# Patient Record
Sex: Female | Born: 1965 | Race: White | Hispanic: No | Marital: Married | Smoking: Never smoker
Health system: Southern US, Community
[De-identification: ages and names within clinical notes are randomized; demographics above are authoritative.]

## PROBLEM LIST (undated history)

## (undated) DIAGNOSIS — K219 Gastro-esophageal reflux disease without esophagitis: Secondary | ICD-10-CM

## (undated) DIAGNOSIS — M8580 Other specified disorders of bone density and structure, unspecified site: Secondary | ICD-10-CM

## (undated) DIAGNOSIS — S161XXA Strain of muscle, fascia and tendon at neck level, initial encounter: Secondary | ICD-10-CM

## (undated) DIAGNOSIS — M1990 Unspecified osteoarthritis, unspecified site: Secondary | ICD-10-CM

## (undated) DIAGNOSIS — Z923 Personal history of irradiation: Secondary | ICD-10-CM

## (undated) DIAGNOSIS — C50919 Malignant neoplasm of unspecified site of unspecified female breast: Secondary | ICD-10-CM

## (undated) DIAGNOSIS — R112 Nausea with vomiting, unspecified: Secondary | ICD-10-CM

## (undated) DIAGNOSIS — Z9889 Other specified postprocedural states: Secondary | ICD-10-CM

## (undated) HISTORY — DX: Unspecified osteoarthritis, unspecified site: M19.90

## (undated) HISTORY — DX: Other specified postprocedural states: Z98.890

## (undated) HISTORY — PX: AUGMENTATION MAMMAPLASTY: SUR837

## (undated) HISTORY — PX: OTHER SURGICAL HISTORY: SHX169

## (undated) HISTORY — DX: Gastro-esophageal reflux disease without esophagitis: K21.9

## (undated) HISTORY — DX: Malignant neoplasm of unspecified site of unspecified female breast: C50.919

## (undated) HISTORY — DX: Other specified postprocedural states: R11.2

## (undated) HISTORY — DX: Other specified disorders of bone density and structure, unspecified site: M85.80

## (undated) HISTORY — DX: Strain of muscle, fascia and tendon at neck level, initial encounter: S16.1XXA

---
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
## 1991-12-24 ENCOUNTER — Encounter: Payer: Self-pay | Admitting: Family Medicine

## 1991-12-24 LAB — CONVERTED CEMR LAB
RBC count: 4.48 10*6/uL
WBC, blood: 6.1 10*3/uL

## 1997-09-24 ENCOUNTER — Encounter: Payer: Self-pay | Admitting: Family Medicine

## 1997-09-24 LAB — CONVERTED CEMR LAB
RBC count: 4.39 10*6/uL
WBC, blood: 5.9 10*3/uL

## 2000-08-27 ENCOUNTER — Other Ambulatory Visit: Admission: RE | Admit: 2000-08-27 | Discharge: 2000-08-27 | Payer: Self-pay | Admitting: Obstetrics and Gynecology

## 2001-08-01 ENCOUNTER — Encounter: Payer: Self-pay | Admitting: Family Medicine

## 2001-08-01 LAB — CONVERTED CEMR LAB
RBC count: 4.55 10*6/uL
TSH: 1.511 microintl units/mL
WBC, blood: 6 10*3/uL

## 2001-08-11 ENCOUNTER — Encounter: Payer: Self-pay | Admitting: Gastroenterology

## 2001-08-11 ENCOUNTER — Ambulatory Visit (HOSPITAL_COMMUNITY): Admission: RE | Admit: 2001-08-11 | Discharge: 2001-08-11 | Payer: Self-pay | Admitting: Gastroenterology

## 2001-08-22 ENCOUNTER — Encounter (INDEPENDENT_AMBULATORY_CARE_PROVIDER_SITE_OTHER): Payer: Self-pay | Admitting: Specialist

## 2001-08-22 ENCOUNTER — Encounter: Payer: Self-pay | Admitting: Gastroenterology

## 2001-08-22 ENCOUNTER — Other Ambulatory Visit: Admission: RE | Admit: 2001-08-22 | Discharge: 2001-08-22 | Payer: Self-pay | Admitting: Gastroenterology

## 2001-09-29 ENCOUNTER — Ambulatory Visit (HOSPITAL_COMMUNITY): Admission: RE | Admit: 2001-09-29 | Discharge: 2001-09-29 | Payer: Self-pay | Admitting: Gastroenterology

## 2001-09-30 ENCOUNTER — Other Ambulatory Visit: Admission: RE | Admit: 2001-09-30 | Discharge: 2001-09-30 | Payer: Self-pay | Admitting: Obstetrics and Gynecology

## 2001-10-07 ENCOUNTER — Encounter: Admission: RE | Admit: 2001-10-07 | Discharge: 2002-01-05 | Payer: Self-pay | Admitting: Otolaryngology

## 2002-11-02 ENCOUNTER — Other Ambulatory Visit: Admission: RE | Admit: 2002-11-02 | Discharge: 2002-11-02 | Payer: Self-pay | Admitting: Obstetrics and Gynecology

## 2003-12-01 ENCOUNTER — Other Ambulatory Visit: Admission: RE | Admit: 2003-12-01 | Discharge: 2003-12-01 | Payer: Self-pay | Admitting: Obstetrics and Gynecology

## 2004-11-17 ENCOUNTER — Ambulatory Visit: Payer: Self-pay | Admitting: Gastroenterology

## 2004-12-27 ENCOUNTER — Other Ambulatory Visit: Admission: RE | Admit: 2004-12-27 | Discharge: 2004-12-27 | Payer: Self-pay | Admitting: Obstetrics and Gynecology

## 2005-01-24 ENCOUNTER — Ambulatory Visit: Payer: Self-pay | Admitting: Family Medicine

## 2005-10-08 ENCOUNTER — Ambulatory Visit: Payer: Self-pay | Admitting: Family Medicine

## 2006-01-17 ENCOUNTER — Ambulatory Visit: Payer: Self-pay | Admitting: Gastroenterology

## 2006-02-06 ENCOUNTER — Other Ambulatory Visit: Admission: RE | Admit: 2006-02-06 | Discharge: 2006-02-06 | Payer: Self-pay | Admitting: Obstetrics and Gynecology

## 2006-10-22 HISTORY — PX: NECK SURGERY: SHX720

## 2007-07-30 ENCOUNTER — Ambulatory Visit: Payer: Self-pay | Admitting: Gastroenterology

## 2007-08-12 ENCOUNTER — Ambulatory Visit: Payer: Self-pay | Admitting: Family Medicine

## 2007-08-13 ENCOUNTER — Ambulatory Visit: Payer: Self-pay | Admitting: Family Medicine

## 2007-08-15 ENCOUNTER — Encounter: Payer: Self-pay | Admitting: Family Medicine

## 2007-08-25 ENCOUNTER — Ambulatory Visit: Payer: Self-pay | Admitting: *Deleted

## 2007-09-02 ENCOUNTER — Encounter: Payer: Self-pay | Admitting: Family Medicine

## 2007-09-12 ENCOUNTER — Inpatient Hospital Stay (HOSPITAL_COMMUNITY): Admission: RE | Admit: 2007-09-12 | Discharge: 2007-09-13 | Payer: Self-pay | Admitting: Neurosurgery

## 2007-09-12 ENCOUNTER — Ambulatory Visit: Payer: Self-pay | Admitting: Internal Medicine

## 2007-10-06 ENCOUNTER — Encounter: Payer: Self-pay | Admitting: Family Medicine

## 2008-02-26 ENCOUNTER — Ambulatory Visit: Payer: Self-pay | Admitting: Family Medicine

## 2008-02-27 ENCOUNTER — Encounter: Payer: Self-pay | Admitting: Family Medicine

## 2008-02-27 ENCOUNTER — Telehealth (INDEPENDENT_AMBULATORY_CARE_PROVIDER_SITE_OTHER): Payer: Self-pay | Admitting: Internal Medicine

## 2008-03-01 ENCOUNTER — Ambulatory Visit: Payer: Self-pay | Admitting: Family Medicine

## 2008-03-01 ENCOUNTER — Telehealth: Payer: Self-pay | Admitting: Family Medicine

## 2008-03-04 ENCOUNTER — Telehealth: Payer: Self-pay | Admitting: Family Medicine

## 2008-03-04 ENCOUNTER — Encounter: Payer: Self-pay | Admitting: Family Medicine

## 2008-03-22 ENCOUNTER — Ambulatory Visit: Payer: Self-pay | Admitting: Family Medicine

## 2008-04-09 IMAGING — CR DG CERVICAL SPINE 2 OR 3 VIEWS
1 series · 1 of 1 positions shown · non-contrast
Comparison: none

CLINICAL DATA: Anterior cervical spine fusion.
 CERVICAL SPINE ? 2 VIEW:

[view not recorded]
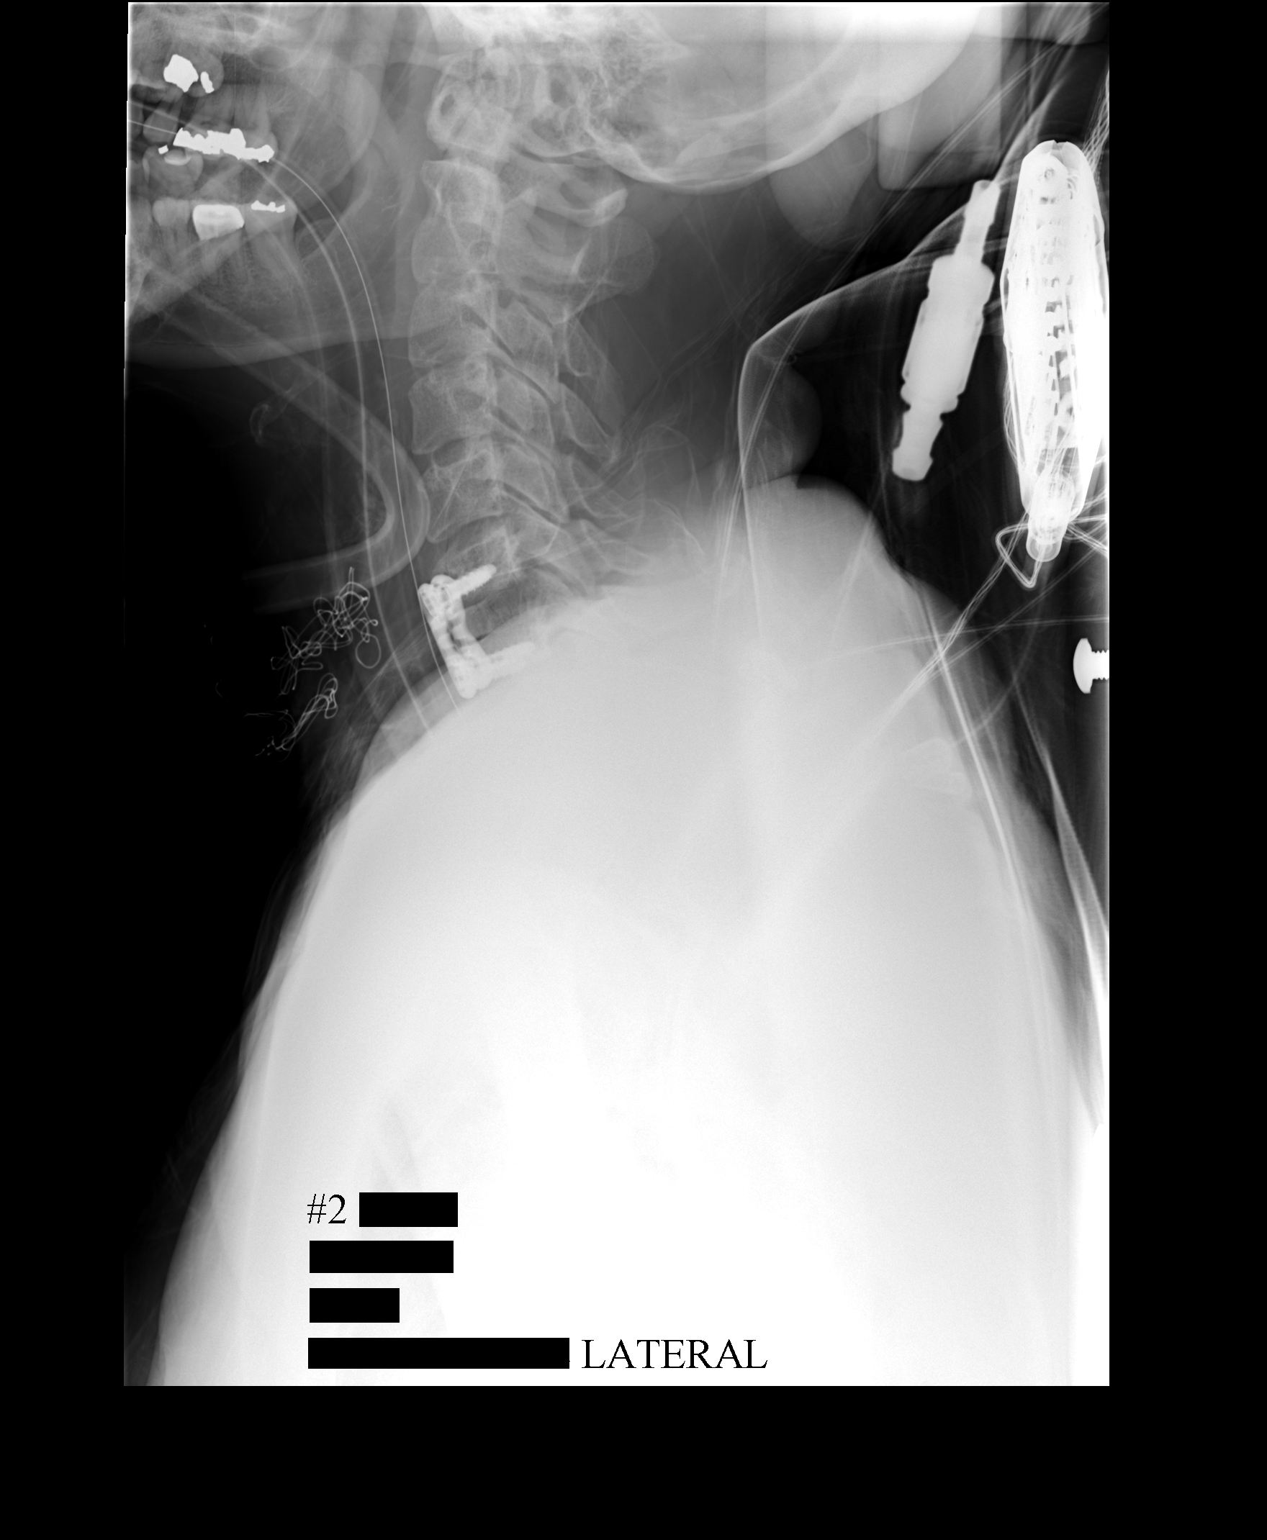

[1 of 1 positions shown; findings below may reference images not displayed]

FINDINGS: Two lateral views of the cervical spine were returned.  The initial film shows a needle positioned anteriorly directed towards the C6-7 level.  A second film shows anterior fusion has been performed at C6-7.  The interbody fusion plug is in good position.
IMPRESSION: Anterior fusion at C6-7.

## 2008-05-25 ENCOUNTER — Encounter: Admission: RE | Admit: 2008-05-25 | Discharge: 2008-05-25 | Payer: Self-pay | Admitting: Neurosurgery

## 2008-09-27 ENCOUNTER — Telehealth: Payer: Self-pay | Admitting: Family Medicine

## 2008-10-05 ENCOUNTER — Telehealth: Payer: Self-pay | Admitting: Family Medicine

## 2008-10-06 ENCOUNTER — Telehealth: Payer: Self-pay | Admitting: Family Medicine

## 2008-10-13 ENCOUNTER — Ambulatory Visit: Payer: Self-pay | Admitting: Family Medicine

## 2008-10-13 DIAGNOSIS — K219 Gastro-esophageal reflux disease without esophagitis: Secondary | ICD-10-CM | POA: Insufficient documentation

## 2008-11-04 ENCOUNTER — Ambulatory Visit: Payer: Self-pay | Admitting: Family Medicine

## 2009-03-28 ENCOUNTER — Ambulatory Visit: Payer: Self-pay | Admitting: Family Medicine

## 2009-03-29 ENCOUNTER — Encounter: Payer: Self-pay | Admitting: Family Medicine

## 2009-03-30 ENCOUNTER — Ambulatory Visit: Payer: Self-pay | Admitting: Family Medicine

## 2009-03-30 DIAGNOSIS — J383 Other diseases of vocal cords: Secondary | ICD-10-CM | POA: Insufficient documentation

## 2009-10-18 ENCOUNTER — Encounter: Payer: Self-pay | Admitting: Family Medicine

## 2009-10-18 ENCOUNTER — Telehealth: Payer: Self-pay | Admitting: Family Medicine

## 2010-05-29 ENCOUNTER — Encounter (INDEPENDENT_AMBULATORY_CARE_PROVIDER_SITE_OTHER): Payer: Self-pay | Admitting: *Deleted

## 2010-10-25 ENCOUNTER — Encounter: Payer: Self-pay | Admitting: Family Medicine

## 2010-10-25 ENCOUNTER — Telehealth: Payer: Self-pay | Admitting: Family Medicine

## 2010-10-26 ENCOUNTER — Ambulatory Visit
Admission: RE | Admit: 2010-10-26 | Discharge: 2010-10-26 | Payer: Self-pay | Source: Home / Self Care | Attending: Family Medicine | Admitting: Family Medicine

## 2010-11-21 NOTE — Medication Information (Signed)
Summary: Approval for Additional Quantity Nexium/[employer]  Approval for Additional Quantity Nexium/[employer]   Imported By: Lanelle Bal 10/24/2009 13:23:18  _____________________________________________________________________  External Attachment:    Type:   Image     Comment:   External Document

## 2010-11-21 NOTE — Letter (Signed)
Summary: Nadara Eaton letter  [place] at [facility]  [address] [place], [place] [address]   Phone: [phone]  Fax: [phone]       05/29/2010 MRN: [id number]  [facility] [address] [place], [place]  [address]  Dear Ms. Bumpass,  [facility] and [place] announce the retirement of Arta Silence, M.D., from full-time practice at the [facility] office effective April 20, 2010 and his plans of returning part-time.  It is important to Dr. Hetty Ely and to our practice that you understand that [facility] has seven physicians in our office for your health care needs.  We will continue to offer the same exceptional care that you have today.    Dr. Hetty Ely has spoken to many of you about his plans for retirement and returning part-time in the fall.   We will continue to work with you through the transition to schedule appointments for you in the office and meet the high standards that [place] is committed to.   Again, it is with great pleasure that we share the news that Dr. Hetty Ely will return to [facility] in October of 2011 with a reduced schedule.    If you have any questions, or would like to request an appointment with one of our physicians, please call us at [phone] and press the option for Scheduling an appointment.  We take pleasure in providing you with excellent patient care and look forward to seeing you at your next office visit.  Our [facility] Physicians are:  Tillman Abide, M.D. Laurita Quint, M.D. Roxy Manns, M.D. Kerby Nora, M.D. Hannah Beat, M.D. Ruthe Mannan, M.D. We proudly welcomed Raechel Ache, M.D. and Eustaquio Boyden, M.D. to the practice in July/August 2011.  Sincerely,  [facility]

## 2010-11-23 NOTE — Assessment & Plan Note (Signed)
Summary: refill meds/alc   Vital Signs:  Patient profile:   45 year old female Height:      66.5 inches Weight:      144.69 pounds BMI:     23.09 Temp:     98.8 degrees F oral Pulse rate:   76 / minute Pulse rhythm:   regular BP sitting:   110 / 70  (left arm) Cuff size:   regular  Vitals Entered By: Sydell Axon LPN (October 26, 2010 2:37 PM) CC: Needs a refill on medication   History of Present Illness: Pt here for foillowup, hasn't been here in 11/2 years and feels well. She takes Nexium twice a day and sees if not taken, she has burning in the throat/midchest area and belching. She has had lifelong problems with GERD, had EGD initially in HS. She tries to watch what she eats and knows tomato and orange products bother her significantly. Her last EGD was 2002. She does not think sxs have worsened or changed and are reasonably well controlled with regular twice a day medication. She has not had hoarseness or difficutly with her voice for a long time. That had been a problem around the time of her last EGD. She does say that stress worsens sxs. 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 During the abd exam, when asked, does have occas RUQ discomfort.  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 1 Tab Po Two Times A Day As Needed. 2)  Prednisone 50 Mg Tabs (Prednisone) .... One Tab By Mouth in Am. 3)  Guiatuss Ac 100-10 Mg/68ml Syrp (Guaifenesin-Codeine) .... 1/2- 1 Tsp By Mouth At Night As Needed 4)  Zithromax Z-Pak 250 Mg Tabs (Azithromycin) .... As Dir  Current Medications (verified): 1)  Nexium 40 Mg  Cpdr (Esomeprazole Magnesium) .Marland Kitchen.. 1 Tab Po Two Times A Day As Needed. 2)  Multivitamins  Tabs  (Multiple Vitamin) .... Take One By Mouth Daily 3)  Vitamin E 400 Unit Caps (Vitamin E) .... Take One By Mouth Daily  Allergies: No Known Drug Allergies  Physical Exam  General:  Well-developed,well-nourished,in no acute distress; alert,appropriate and cooperative throughout examination Head:  Normocephalic and atraumatic without obvious abnormalities. No apparent alopecia or balding. Eyes:  Conjunctiva clear bilaterally.  Ears:  External ear exam shows no significant lesions or deformities.  Otoscopic examination reveals clear canals, tympanic membranes are intact bilaterally without bulging, retraction, inflammation or discharge. Hearing is grossly normal bilaterally. Nose:  External nasal examination shows no deformity or inflammation. Nasal mucosa are pink and moist without lesions or exudates. Mouth:  Oral mucosa and oropharynx without lesions or exudates.  Teeth in good repair. Neck:  No deformities, masses, or tenderness noted. Lungs:  Min ronchi of the chest. Heart:  Normal rate and regular rhythm. S1 and S2 normal without gallop, murmur, click, rub or other extra sounds. Abdomen:  Bowel sounds positive,abdomen soft and non-tender without masses, organomegaly or hernias noted.   Impression & Recommendations:  Problem # 1:  GERD (W/U BY DR Russella Dar, SEE NOTE 10/13/08) (ICD-530.81) Assessment Unchanged  Reasonably stable. Check RUQ pain vs diet and report if consistent. Her updated medication list for this problem includes:  Nexium 40 Mg Cpdr (Esomeprazole magnesium) .Marland Kitchen... 1 tab po two times a day as needed.  Diagnostics Reviewed:  Discussed lifestyle modifications, diet, antacids/medications, and preventive measures. Handout provided.   Complete Medication List: 1)  Nexium 40 Mg Cpdr (Esomeprazole magnesium) .Marland Kitchen.. 1 tab po two times a day as needed. 2)  Multivitamins Tabs (Multiple vitamin) .... Take one by mouth daily 3)  Vitamin E 400 Unit Caps (Vitamin e) .... Take one by  mouth daily Prescriptions: NEXIUM 40 MG  CPDR (ESOMEPRAZOLE MAGNESIUM) 1 tab po two times a day as needed.  #60 x 11   Entered and Authorized by:   Shaune Leeks MD   Signed by:   Shaune Leeks MD on 10/26/2010   Method used:   Electronically to        [facility]* (retail)       [address]       [place], [place]  [address]       Ph: [phone]       Fax: [phone]   RxID:   [id number] NEXIUM 40 MG  CPDR (ESOMEPRAZOLE MAGNESIUM) 1 tab po two times a day as needed.  #60 x 11   Entered by:   Sydell Axon LPN   Authorized by:   Shaune Leeks MD   Signed by:   Sydell Axon LPN on 40/07/2724   Method used:   Electronically to        [facility]* (retail)       [address]       [place], [place]  [address]       Ph: [phone]       Fax: [phone]   RxID:   [id number]    Orders Added: 1)  Est. Patient Level III [09323]    Current Allergies (reviewed today): No known allergies

## 2010-11-23 NOTE — Progress Notes (Signed)
Summary: prior auth needed for nexium  Phone Note From Pharmacy   Caller: [facility] [employer] Summary of Call: Prior Berkley Harvey is needed for nexium, form is on  your desk. Initial call taken by: Lowella Petties CMA, AAMA,  October 25, 2010 4:38 PM  Follow-up for Phone Call        Pt hasn't been seen in 11/2 yrs. Needs to be seen. Follow-up by: Shaune Leeks MD,  October 25, 2010 4:52 PM  Additional Follow-up for Phone Call Additional follow up Details #1::        [facility] for pt to call.               Lowella Petties CMA, AAMA  October 26, 2010 8:08 AM  Patient has an appt for today.  Additional Follow-up by: Melody Comas,  October 26, 2010 8:14 AM     Appended Document: prior auth needed for nexium Prior auth given for nexium, advised pharmacy,  approval letter placed on doctor's desk for signature and scanning.

## 2010-11-23 NOTE — Medication Information (Signed)
Summary: [employer] Prior Auth Approval for Nexium Capsule Dr from 10/06/10-1/  [employer] Prior Auth Approval for Nexium Capsule Dr from 10/06/10-10/27/11   Imported By: Beau Fanny 10/31/2010 15:21:15  _____________________________________________________________________  External Attachment:    Type:   Image     Comment:   External Document

## 2011-03-06 NOTE — Consult Note (Signed)
Tanya Cobb            ACCOUNT NO.:  [id number]   MEDICAL RECORD NO.:  [id number]          PATIENT TYPE:  INP   LOCATION:  3014                         FACILITY:  [facility]   PHYSICIAN:  Pricilla Riffle, MD, FACCDATE OF BIRTH:  1965-11-20   DATE OF CONSULTATION:  09/12/2007  DATE OF DISCHARGE:                                 CONSULTATION   PRIMARY CARE PHYSICIAN:  Dr. Hetty Ely.   PRIMARY CARDIOLOGIST:  Primary cardiologist is new, and will be Dr.  Tenny Craw.   CHIEF COMPLAINT:  Chest pain.   HISTORY OF PRESENT ILLNESS:  [name] is a 45 year old female with no  history of coronary artery disease.  She had neck surgery today and  stated that ever since coming out from under the anesthesia she has been  nauseated.  She has vomited twice and received medication for this  including Zofran and Compazine.  Shortly after the second episode of  vomiting she developed chest pain only with deep inspiration.  It  reaches a 4/10 with moderately deep inspiration, but worsens a little  bit with very deep inspiration.  She stated that she tried to breath  very shallowly to keep it from hurting, but after awhile that made her  feel short of breath.  She received a sublingual nitroglycerin which  caused some transient hypotension that was asymptomatic with a systolic  blood pressure in the 90's, but according to the patient her pain has  not changed.  She does not breathe deeply.  She does not have any pain  at all, but if she tries to take a deep breath it still hurts.  She is  not having significant pain at her incision site, and since receiving  the Compazine the nausea has improved somewhat.  She is, otherwise,  resting comfortably.   PAST MEDICAL HISTORY:  She has no history of diabetes, hypertension,  hyperlipidemia, family history of coronary artery disease prematurely or  tobacco use.  She has a history of gastroesophageal reflux disease with  LPR.   PAST SURGICAL HISTORY:  She has  had esophageal manometry.  She has had  neck surgery today as well as breast implants in the past and laser eye  surgery.   ALLERGIES:  She is not allergic to any drugs, but states that VICODIN  keeps her up all night and she had nausea with CODEINE.   MEDICATIONS:  Currently she is receiving IV fluids at 80 mL an hour and  b.i.d. Protonix.  She is on Nexium b.i.d. at home.   SOCIAL HISTORY:  She lives with her children and works as a Runner, broadcasting/film/video.  She has no history of alcohol, tobacco or drug abuse.   FAMILY HISTORY:  Both of her parents are in their 39's and are healthy.  She has no siblings with coronary artery disease.  One cousin died at  age 23 with an aortic dissection, but there is no family member with a  history of premature coronary artery disease.   REVIEW OF SYSTEMS:  She is having no fevers, chills or sweats.  The  chest pain as described above.  She is not having palpitations.  She is  not having syncope or presyncope.  She denies depression or anxiety.  She has had some left upper extremity numbness prior to the surgery and  right now her left hand more so the fingers than other areas is still  experiencing some numbness.  Full 14-point Review of Systems is,  otherwise, negative.   PHYSICAL EXAMINATION:  VITAL SIGNS:  Temperature is 97.7, blood pressure  91/56, pulse 80, respiratory rate 12, O2 saturation 93% on room air.  GENERAL:  She is a well-developed, well-nourished white female in no  acute distress.  HEENT:  Normal.  NECK:  There is no lymphadenopathy, thyromegaly, bruit or JVD noted.  The incision is without erythema, drainage or hematoma.  CVA:  Heart is regular in rate and rhythm with an S1, S2 and no  significant murmur, rub or gallop is noted.  Distal pulses are intact in  all four extremities, but no femoral bruits appreciated.  LUNGS:  Clear to auscultation bilaterally, and her chest wall is  nontender to palpation.  SKIN:  The incision on her left  anterior neck is well closed.  ABDOMEN:  Soft, nontender with active bowel sounds.  No  hepatosplenomegaly.  EXTREMITIES:  There is no cyanosis, clubbing or edema noted.  MUSCULOSKELETAL:  There is no spine or CVA tenderness noted, and she has  no joint deformity or effusions noted.  NEUROLOGICAL:  She is alert and oriented.  Cranial nerves II-XII grossly  intact.   EKG is sinus rhythm and rate 66 with no acute ischemic changes.   LABORATORY VALUES:  The only lab she had done today was a CBC that  showed hemoglobin 13.5, hematocrit 40, WBC 6.3, platelets 197.   IMPRESSION:  Chest pain:  [name] is a 45 year old female with no  history of coronary artery disease who is seen for chest pain  postoperatively.  Prior to surgery she was very active.  Her chest pain  is atypical for cardiac origin for ischemia.  It is pleuritic and  started after vomiting.  Additionally she has been intubated today.  Her  chest pain may be related to this.  We will follow symptomatically.  No  other testing is indicated at this time unless cardiac enzymes are  elevated or symptoms change.  The situation was discussed with the  patient and her family.  We will try anti-inflammatory medications which  for the first 24 hours will be Toradol IV to see if this helps her  symptoms.  Will check troponins now and in a.m.  They will most likely  be negative.  Further evaluation and treatment will depend on the above  results.      Theodore Demark, PA-C      Pricilla Riffle, MD, [facility]  Electronically Signed    RB/MEDQ  D:  09/12/2007  T:  09/13/2007  Job:  [id number]   cc:   Arta Silence, MD

## 2011-03-06 NOTE — Op Note (Signed)
Tanya Cobb, Tanya Cobb            ACCOUNT NO.:  [id number]   MEDICAL RECORD NO.:  [id number]          PATIENT TYPE:  INP   LOCATION:  3172                         FACILITY:  [facility]   PHYSICIAN:  Coletta Memos, M.D.     DATE OF BIRTH:  01-18-1966   DATE OF PROCEDURE:  09/12/2007  DATE OF DISCHARGE:                               OPERATIVE REPORT   PREOPERATIVE DIAGNOSES:  1. 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  2. Left C7 radiculopathy.   POSTOPERATIVE DIAGNOSES:  1. 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  2. Left C7 radiculopathy.   PROCEDURE:  1. Anterior cervical decompression C6-7.  2. Arthrodesis C6-7 with 7-mm structural allograft.  3. Anterior instrumentation 16-mm Vectra plate with 16-XW screws.   COMPLICATIONS:  None.   SURGEON:  Coletta Memos, M.D.   ASSISTANT:  Payton Doughty, M.D.   INDICATIONS:  Tanya Cobb presents with a history of pain quite  severe in the neck and left upper extremity.  She is weak in the left  upper extremity unable to do her push-ups as she has for years.  I  recommended after other conservative treatment were not successful that  she should consider operative decompression.  She agreed and is admitted  today for said operation.   DESCRIPTION OF PROCEDURE:  [name] was brought to the operating room,  intubated and placed under a general anesthetic without difficulty. Her  head was placed on a horseshoe headrest in neutral position.  Her neck  was prepped and she was draped in a sterile fashion.  I infiltrated 3 mL  0.5% lidocaine 1:200,000 strength epinephrine into the neck after it was  prepped and draped.  I opened the skin with a #10 blade and took this  down through the subcutaneous tissue to the platysma.  I dissected with  Metzenbaum scissors rostrally and caudally in a plane above the  platysma.  I opened the platysma in a horizontal fashion using the same  Metzenbaum scissors.  I then dissected rostrally and caudally in a  plane  inferior to the platysma. I used the Metzenbaum scissors to then create  an avascular corridor along with blunt dissection to the cervical spine.  I placed a spinal needle and that needle was at C6-7.   I proceeded with my C6-7 decompression by first opening the disk space  with a #15 blade.  I reflected the longus colli muscles and placed a  self-retaining retractor. I distracted the disk space by using two pins,  one at C6 the other at C7.  I, with curettes, Kerrison punches and a  high-speed drill proceeded to remove the disk and osteophytes which were  actually larger than I had anticipated.  After doing that I was able to  identify the posterior longitudinal ligament and using a #1 Kerrison  punch get underneath that and expose the thecal sac.  I then used larger  Kerrison punches to fully decompress the spinal canal along with the  drill.  I decompressed both C7 nerve roots using Kerrison punches and a  drill and was able to effect a very wide decompression of both C7  roots.  I removed some disk material which was present on the left side and  certainly appeared to be compressing through the left C7 root.  I  irrigated the wound.  Hemostasis was obtained and I then proceeded with  my arthrodesis.   I drilled the endplates of C6 and C7 to level the surfaces. I sized the  space and felt that a 7-mm graft would work best.  I then placed a 7-mm  structural allograft into the C6-7 disk space.  I removed the  distraction pins to prepare for the instrumentation.   I placed a 16 mm anterior cervical plate using four screws first by  using a hand drill and then self-tapping screws.  This was done with Dr.  Temple Pacini assistance.  The plate and screws were placed without difficulty.  X-rays showed that the plate and screws along with the allograft were in  good position.  I irrigated the wound.  I then closed the wound in  layered fashion with Dr. Temple Pacini assistance.  I reapproximate the  platysma  and subcuticular layer.  This was done with Vicryl.  I used Dermabond  for a sterile dressing.  The patient was then extubated moving all  extremities.           ______________________________  Coletta Memos, M.D.     KC/MEDQ  D:  09/12/2007  T:  09/13/2007  Job:  [id number]

## 2011-03-06 NOTE — Assessment & Plan Note (Signed)
[name]                         GASTROENTEROLOGY OFFICE NOTE   Tanya Cobb, Tanya Cobb                   MRN:          [id number]  DATE:07/30/2007                            DOB:          [birth year]    This is a return office visit for GERD with LPR.  She has no dyspeptic  symptoms or frequent belching.  She has noted vocal changes with  hoarseness and easy vocal fatigue.  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  When her voice gets more  rest  this does not tend to be problematic for her.  She has been  evaluated by Dr. Flo Cobb in the past.  She has no dysphagia,  odynophagia, change in bowel habits, melena, hematochezia, or weight  loss.   CURRENT MEDICATIONS:  1. Nexium 40 mg p.o. b.i.d.  2. Loestrin one daily.   ALLERGIES:  Medication allergies:  None known.   PHYSICAL EXAMINATION:  No acute distress.  Weight 135.8 pounds, blood pressure is 116/72, pulse 72 and regular.  HEENT:  Anicteric sclerae.  Oropharynx clear.  CHEST:  Clear to auscultation bilaterally.  CARDIAC:  Regular rate and rhythm without murmurs appreciated.  ABDOMEN:  Soft, nontender, nondistended.  Normal active bowel sounds.  No palpable organomegaly, masses, or hernias.   ASSESSMENT/PLAN:  Gastroesophageal reflux disease with LPR.  Maintain  all standard antireflux measures and Nexium 40 mg p.o. b.i.d., taken 30-  45 minutes before breakfast and dinner.  May use Tums for breakthrough  symptoms.  If her vocal difficulties continue, I have advised her to  contact Dr. Lazarus Cobb for further evaluation.  Return office visit with me  in one year.     Tanya Cobb. Tanya Dar, MD, [facility]  Electronically Signed    MTS/MedQ  DD: 07/30/2007  DT: 07/30/2007  Job #: [id number]

## 2011-03-09 NOTE — Procedures (Signed)
[place]  Patient:    Tanya Cobb, Tanya Cobb Visit Number: [id number] MRN: [id number]          Service Type: END Location: ENDO Attending Physician:  Starr Sinclair Dictated by:   Venita Lick. Pleas Koch., M.D. [facility] Proc. Date: 09/29/01 Admit Date:  09/29/2001 Discharge Date: 09/29/2001                             Procedure Report  PROCEDURE:  Esophageal manometry.  INDICATIONS:  A 45 year old white female with GERD and persistent hoarseness.  REPORT:  The upper esophageal sphincter had a slightly elevated peak pressure at 199.9 mmHg and a slightly prolonged duration of relaxation at 745 msec. The remainder of the upper esophageal study was unremarkable.  The upper esophageal showed normal peristalsis and normal pressures.  The lower esophageal body showed normal peristalsis with 10/10 swallows generating peristaltic contractions and normal pressures.  The lower esophageal sphincter study showed that the lower esophageal sphincter pressure was 44.1 mmHg with a residual pressure that was minimally elevated at 9.4 mmHg.  There was 79% relaxation noted.  IMPRESSION:  Mildly elevated lower esophageal residual pressure and a mildly dilated upper esophageal sphincter peak pressure, which are both nonspecific and mild abnormalities.  RECOMMENDATIONS:  Continue treatment for reflux and office follow-up visit as planned. Dictated by:   Venita Lick. Pleas Koch., M.D. [facility] Attending Physician:  Starr Sinclair DD:  10/06/01 TD:  10/06/01 Job: [id number]

## 2011-03-13 ENCOUNTER — Encounter: Payer: Self-pay | Admitting: Gastroenterology

## 2011-03-14 ENCOUNTER — Ambulatory Visit: Payer: Self-pay | Admitting: Gastroenterology

## 2011-05-07 ENCOUNTER — Ambulatory Visit (INDEPENDENT_AMBULATORY_CARE_PROVIDER_SITE_OTHER): Payer: BC Managed Care – PPO | Admitting: Gastroenterology

## 2011-05-07 ENCOUNTER — Encounter: Payer: Self-pay | Admitting: Gastroenterology

## 2011-05-07 ENCOUNTER — Other Ambulatory Visit (INDEPENDENT_AMBULATORY_CARE_PROVIDER_SITE_OTHER): Payer: BC Managed Care – PPO

## 2011-05-07 VITALS — BP 98/64 | HR 80 | Ht 67.0 in | Wt 140.6 lb

## 2011-05-07 DIAGNOSIS — K219 Gastro-esophageal reflux disease without esophagitis: Secondary | ICD-10-CM

## 2011-05-07 DIAGNOSIS — R195 Other fecal abnormalities: Secondary | ICD-10-CM

## 2011-05-07 MED ORDER — PEG-KCL-NACL-NASULF-NA ASC-C 100 G PO SOLR: 1.0000 | Freq: Once | ORAL | Status: DC

## 2011-05-07 NOTE — Progress Notes (Signed)
History of Present Illness: This is a 45 year old female who was recently found to have Hemoccult-positive stool by screening Hemoccults done Dr. Lisbeth Ply office. She has chronic GERD with LPR which is well controlled on antireflux measures and daily Nexium. 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 She has a great grandparent with colon cancer no other family members colon cancer or colon polyps. She notes occasional belching and bloating. She denies change in bowel that's, change in stool caliber, weight loss, abdominal pain, melena, hematochezia, dysphagia, odynophagia I Past Medical History  Diagnosis Date  . GERD (gastroesophageal reflux disease)     w/ LPR  . Cervical strain    Past Surgical History  Procedure Date  . Neck surgery 2008    reports that she has never smoked. She has never used smokeless tobacco. She reports that she drinks alcohol. She reports that she does not use illicit drugs. family history includes Colon cancer in an unspecified family member; Diabetes in an unspecified family member; Heart disease in her maternal grandmother; and Ovarian cancer in her maternal grandmother. No Known Allergies    Outpatient Encounter Prescriptions as of 05/07/2011  Medication Sig Dispense Refill  . esomeprazole (NEXIUM) 40 MG capsule Take 40 mg by mouth 2 (two) times daily as needed.        . Multiple Vitamin (MULTIVITAMIN) tablet Take 1 tablet by mouth daily.        . Omega-3 Fatty Acids (FISH OIL) 1000 MG CAPS Take 1 capsule by mouth daily.        . vitamin E 400 UNIT capsule Take 400 Units by mouth daily.        . peg 3350 powder (MOVIPREP) 100 G SOLR Take 1 kit (100 g total) by mouth once.  1 kit  0   Review of Systems: Pertinent positive and negative review of systems were noted in the above HPI section. All other review of  systems were otherwise negative.  Physical Exam: General: Well developed , well nourished, no acute distress Head: Normocephalic and atraumatic Eyes:  sclerae anicteric, EOMI Ears: Normal auditory acuity Mouth: No deformity or lesions Neck: Supple, no masses or thyromegaly Lungs: Clear throughout to auscultation Heart: Regular rate and rhythm; no murmurs, rubs or bruits Abdomen: Soft, non tender and non distended. No masses, hepatosplenomegaly or hernias noted. Normal Bowel sounds Rectal: No lesions Hemoccult negative brown stool in the vault Musculoskeletal: Symmetrical with no gross deformities  Skin: No lesions on visible extremities Pulses:  Normal pulses noted Extremities: No clubbing, cyanosis, edema or deformities noted Neurological: Alert oriented x 4, grossly nonfocal Cervical Nodes:  No significant cervical adenopathy Inguinal Nodes: No significant inguinal adenopathy Psychological:  Alert and cooperative. Normal mood and affect  Assessment and Recommendations:  1. Hemoccult-positive stool. Asymptomatic. Rule out colorectal neoplasms, hemorrhoids and other sources of occult blood loss. The risks, benefits, and alternatives to colonoscopy with possible biopsy and possible polypectomy were discussed with the patient and they consent to proceed.   2. GERD with LPR. Continue Nexium 40 mg every morning. She asked about long-term side effects of Nexium and she has been obtaining routine bone density scan for Dr. Lisbeth Ply office which have been normal by her report. Obtain a magnesium level today. She should continue on Nexium and antireflux measures.

## 2011-05-07 NOTE — Patient Instructions (Signed)
Go directly to the basement to have your labs drawn today.  You have been scheduled for a Colonoscopy. Separate instructions given Pick up your prep kit from your pharmacy.  cc: Richardean Chimera, MD

## 2011-05-09 ENCOUNTER — Encounter: Payer: Self-pay | Admitting: Gastroenterology

## 2011-05-09 ENCOUNTER — Ambulatory Visit (AMBULATORY_SURGERY_CENTER): Payer: BC Managed Care – PPO | Admitting: Gastroenterology

## 2011-05-09 DIAGNOSIS — K219 Gastro-esophageal reflux disease without esophagitis: Secondary | ICD-10-CM

## 2011-05-09 DIAGNOSIS — R195 Other fecal abnormalities: Secondary | ICD-10-CM

## 2011-05-09 MED ORDER — SODIUM CHLORIDE 0.9 % IV SOLN: 500.0000 mL | INTRAVENOUS | Status: DC

## 2011-05-09 NOTE — Patient Instructions (Signed)
Please review the discharge papers given to you by your recovery room nurse.  The exam today was normal and Dr Russella Dar recommends to have a repeat exam in 10 years.  If you experience any problems after discharge please call [phone].  You will receive a call in the am from one of our nurses to see how you are doing and answer any questions that you may have.

## 2011-05-10 ENCOUNTER — Telehealth: Payer: Self-pay | Admitting: *Deleted

## 2011-05-10 NOTE — Telephone Encounter (Signed)
Message left to call us if she has any problems or questions.

## 2011-07-31 LAB — CBC
HCT: 40
Hemoglobin: 13.5
MCV: 91.6
Platelets: 197
RBC: 4.36
WBC: 6.3

## 2011-12-25 ENCOUNTER — Other Ambulatory Visit: Payer: Self-pay | Admitting: *Deleted

## 2011-12-25 MED ORDER — ESOMEPRAZOLE MAGNESIUM 40 MG PO CPDR: 40.0000 mg | DELAYED_RELEASE_CAPSULE | Freq: Two times a day (BID) | ORAL | Status: DC | PRN

## 2011-12-31 ENCOUNTER — Telehealth: Payer: Self-pay | Admitting: *Deleted

## 2011-12-31 NOTE — Telephone Encounter (Signed)
PA form in your IN box. 

## 2011-12-31 NOTE — Telephone Encounter (Signed)
Received a call from patient stating that [facility] pharmacy has been trying to contact us regarding a PA for Nexium.  I don't see anything in patients chart regarding a PA.  Called [facility] [employer] at [phone] and rep will fax PA forms to our office today.  Left message on patients cell phone voicemail that PA forms have been requested and that I will ask Dr. Para March to complete PA, however since she has not established care with another provider here he may or may not complete PA.  Advised patient that we do have samples of Nexium until PA is complete.

## 2012-01-01 ENCOUNTER — Telehealth: Payer: Self-pay | Admitting: Family Medicine

## 2012-01-01 NOTE — Telephone Encounter (Signed)
To: [facility] (Daytime Triage) Fax: [phone] From: [facility] Date/ Time: 01/01/2012 10:57 AM Taken By: Annalee Genta, CSR Caller: Judeth Cornfield Facility: not collected Patient: Tanya Cobb, Tanya Cobb DOB: 16-Nov-1965 Phone: [phone] Reason for Call: Pt returning call to [facility]; advises she spoke with her yesterday re prior authorization on Nexium. She was to speak w/Dr Para March and let her know. Please call her to advise; she advises she is out now. Regarding Appointment: Appt Date: Appt Time: Unknown Provider: Reason: Details: Outcome:

## 2012-01-01 NOTE — Telephone Encounter (Signed)
I'll work on the PA, please give her samples in meantime if we have any.  Thanks.

## 2012-01-01 NOTE — Telephone Encounter (Signed)
Patient advised.

## 2012-01-02 NOTE — Telephone Encounter (Signed)
Done, please send in.  

## 2012-01-02 NOTE — Telephone Encounter (Signed)
PA approved for Nexium, approved from 12/12/2011 until 01/01/2013.  Patient advised via message left on cell phone voicemail, message also left on voicemail at [facility].  PA form sent to be scanned.

## 2012-03-06 ENCOUNTER — Other Ambulatory Visit: Payer: Self-pay | Admitting: *Deleted

## 2012-03-06 NOTE — Telephone Encounter (Signed)
Patient hasn't been seen in a while, ok to refill?

## 2012-03-07 MED ORDER — ESOMEPRAZOLE MAGNESIUM 40 MG PO CPDR: 40.0000 mg | DELAYED_RELEASE_CAPSULE | Freq: Two times a day (BID) | ORAL | Status: DC | PRN

## 2012-03-07 NOTE — Telephone Encounter (Signed)
Sent.  Call pt to schedule visit this summer.  Thanks.

## 2012-07-21 ENCOUNTER — Encounter: Payer: Self-pay | Admitting: Family Medicine

## 2012-07-21 ENCOUNTER — Telehealth: Payer: Self-pay | Admitting: Family Medicine

## 2012-07-21 ENCOUNTER — Ambulatory Visit (INDEPENDENT_AMBULATORY_CARE_PROVIDER_SITE_OTHER): Payer: BC Managed Care – PPO | Admitting: Family Medicine

## 2012-07-21 VITALS — BP 102/66 | HR 66 | Temp 98.3°F | Wt 142.0 lb

## 2012-07-21 DIAGNOSIS — R4182 Altered mental status, unspecified: Secondary | ICD-10-CM

## 2012-07-21 NOTE — Patient Instructions (Addendum)
See Shirlee Limerick about your referral before you leave today. Go to the lab on the way out.  We'll contact you with your lab report.

## 2012-07-21 NOTE — Assessment & Plan Note (Addendum)
Unclear source with a broad ddx.  Migraine is possible, but this is atypical.  No etoh, drugs to explain the episodes.  She doesn't have post ictal symptoms. No CP or other sx to suggest CVA or vascular event.  She has no new meds to explain the event.  She has a normal exam today.  If she has another episode that doesn't quickly resolve, or if she has focal neuro sx, then I would advise ER eval.  Given her lack of sx today, will work [place] as an outpatient.  Check CT head (UPREG neg) and cmet/cbc.  She agrees.  If labs and CT unremarkable, will likely ask for neuro input.  >25 min spent with face to face with patient, >50% counseling and/or coordinating care.   Addendum 07/23/12.  I talked with patient.  She feels well.  She has no other symptoms.  I offered neuro referral.  She would like to defer.  This is reasonable.  If she has no other symptoms (and she doesn't currently and hasn't since the OV), then she wouldn't need further eval.  If she does have another episode, she'll notify me and we'll set up referral.  We both agreed that as well as she feels now, referral would likely be low yield.

## 2012-07-21 NOTE — Progress Notes (Signed)
Yesterday she had a new set of symptoms.  ~11AM had sex with husband.  Took a nap.  About 12:30 she was getting out of bed and going to leave the house for exercise.  She was going to power walk but she had trouble remembering if she had all of her needed equipment.  When her husband caught up with her, she had abnormal thoughts.  She couldn't tell if her thoughts were accurate-she felt like she couldn't tell if her prev thoughts/dreams were 'real' memories.  She was asking the same questions repeatedly to her husband.  She wasn't hallucinating and she wasn't lost, but she wouldn't have been able to solve complex problems.  She went back home with her husband.  Overall, the episode passed.    Faituged today was able to go to work.  No exactly similar sx but she had trouble recalling a question that she was to ask today at work, during an interview. That episode lasted about 3-4 minutes.  She had a black string move across her vision today during the event.  It resolved within 30 minutes.  This visual event isn't a new phenomena per patient, had happened espisodically over the years.    No FCNAVD, rash, abnormal movements.  No tongue biting, CP, syncope.  No motor changes.  No speech changes.  Mild HA today, over L brow, resolved now.  Meds, vitals, and allergies reviewed.   ROS: See HPI.  Otherwise, noncontributory.  GEN: nad, alert and oriented HEENT: mucous membranes moist NECK: supple w/o LA CV: rrr.  no murmur PULM: ctab, no inc wob ABD: soft, +bs EXT: no edema SKIN: no acute rash CN 2-12 wnl B, S/S/DTR wnl x4

## 2012-07-21 NOTE — Telephone Encounter (Signed)
Caller: [name]/Patient; Patient Name: Tanya Cobb; PCP: Crawford Givens Clelia Croft) [facility]); Best Callback Phone Number: [phone]  07-20-12  had taken nap and got up and was taking a fast walk and she said she was oriented to who and where she was but she was having some confusion to what was going on and what she might have been imagining.   She said it lasted a couple hours and went away on own   07-21-12  feels clearer but is having a mild headache   All emergent symptoms per Confusion, Disorientation , Agitation ruled out  except for all other situations.   Appointment made for today 07-21-12 at 1545 with Dr Para March

## 2012-07-22 ENCOUNTER — Ambulatory Visit (INDEPENDENT_AMBULATORY_CARE_PROVIDER_SITE_OTHER)
Admission: RE | Admit: 2012-07-22 | Discharge: 2012-07-22 | Disposition: A | Discharge status: Home / Self Care | Payer: BC Managed Care – PPO | Source: Ambulatory Visit | Attending: Family Medicine | Admitting: Family Medicine

## 2012-07-22 DIAGNOSIS — R4182 Altered mental status, unspecified: Secondary | ICD-10-CM

## 2012-07-22 LAB — CBC WITH DIFFERENTIAL/PLATELET
Basophils Relative: 0.3 % (ref 0.0–3.0)
Eosinophils Relative: 1.8 % (ref 0.0–5.0)
HCT: 43.2 % (ref 36.0–46.0)
Hemoglobin: 14 g/dL (ref 12.0–15.0)
Lymphs Abs: 2.1 10*3/uL (ref 0.7–4.0)
MCV: 94.1 fl (ref 78.0–100.0)
Monocytes Absolute: 0.5 10*3/uL (ref 0.1–1.0)
Neutro Abs: 5.3 10*3/uL (ref 1.4–7.7)
RBC: 4.59 Mil/uL (ref 3.87–5.11)
WBC: 8.2 10*3/uL (ref 4.5–10.5)

## 2012-07-22 LAB — COMPREHENSIVE METABOLIC PANEL
Alkaline Phosphatase: 45 U/L (ref 39–117)
BUN: 12 mg/dL (ref 6–23)
GFR: 66.67 mL/min (ref >60.00)
Glucose, Bld: 83 mg/dL (ref 70–99)
Total Bilirubin: 0.5 mg/dL (ref 0.3–1.2)

## 2012-10-02 ENCOUNTER — Other Ambulatory Visit: Payer: Self-pay | Admitting: *Deleted

## 2012-10-02 MED ORDER — ESOMEPRAZOLE MAGNESIUM 40 MG PO CPDR: 40.0000 mg | DELAYED_RELEASE_CAPSULE | Freq: Two times a day (BID) | ORAL | Status: DC | PRN

## 2013-01-29 ENCOUNTER — Telehealth: Payer: Self-pay

## 2013-01-29 NOTE — Telephone Encounter (Signed)
[facility] faxed PA for Nexium; called [phone] spoke with [facility] and renewed PA case # [id number] approval dates 01/29/13 - 01/29/14. [facility] notified and rx went thru. Pt notified via work v/m. Approval letter to follow.

## 2013-11-05 ENCOUNTER — Other Ambulatory Visit: Payer: Self-pay | Admitting: Family Medicine

## 2013-11-25 ENCOUNTER — Ambulatory Visit (INDEPENDENT_AMBULATORY_CARE_PROVIDER_SITE_OTHER): Payer: BC Managed Care – PPO | Admitting: Family Medicine

## 2013-11-25 ENCOUNTER — Encounter: Payer: Self-pay | Admitting: Family Medicine

## 2013-11-25 VITALS — BP 98/64 | HR 74 | Temp 98.5°F | Wt 144.5 lb

## 2013-11-25 DIAGNOSIS — K219 Gastro-esophageal reflux disease without esophagitis: Secondary | ICD-10-CM

## 2013-11-25 MED ORDER — ESOMEPRAZOLE MAGNESIUM 40 MG PO CPDR: 40.0000 mg | DELAYED_RELEASE_CAPSULE | Freq: Two times a day (BID) | ORAL | Status: DC

## 2013-11-25 NOTE — Progress Notes (Signed)
Pre-visit discussion using our clinic review tool. No additional management support is needed unless otherwise documented below in the visit note.  Extensive GI w/u prev with LPR noted.  On PPI and needs to continue. She has occ diet related flares with throat irritation but overall improved on meds.  No dysphagia, no vomiting.  She can occ regurgitate food with a burp.  No fevers.  No blood in stool.    Diet and exercise are generally good.   (noted prev HPI: She has been seen by Dr Fuller Plan regularly for last fifteen years and has had endoscopy multiple times, has had manometry, has had Ph probe and was seen by ENT to check larynx which showed scarring. She has been on Nexium two times a day for seven years after being on Prilosec initially , then Nexium daily and finally Nexium two times a day. This has kept things under control since being on two times a day. )  Meds, vitals, and allergies reviewed.   ROS: See HPI.  Otherwise, noncontributory.  GEN: nad, alert and oriented HEENT: mucous membranes moist NECK: supple w/o LA CV: rrr.   PULM: ctab, no inc wob ABD: soft, +bs

## 2013-11-25 NOTE — Patient Instructions (Addendum)
See if you have had a tetanus shot in the last 10 years.   Don't change your meds.  Take care.

## 2013-11-25 NOTE — Assessment & Plan Note (Signed)
Controlled, continue current meds and healthy diet.  F/u prn.

## 2014-03-03 ENCOUNTER — Telehealth: Payer: Self-pay

## 2014-03-03 NOTE — Telephone Encounter (Signed)
Pt request Prior auth for Nexium to be done; spoke with Jinny Blossom at [facility] and PA has been faxed to our office. Pt request cb when PA completed.(advised pharmacy usually contacts pt but pt request cb).

## 2014-03-09 NOTE — Telephone Encounter (Signed)
Prior auth sent via [facility] on 03/08/14.   

## 2014-03-12 NOTE — Telephone Encounter (Signed)
Approval letter scanned.

## 2014-04-20 ENCOUNTER — Other Ambulatory Visit: Payer: Self-pay | Admitting: Obstetrics and Gynecology

## 2014-04-20 DIAGNOSIS — R928 Other abnormal and inconclusive findings on diagnostic imaging of breast: Secondary | ICD-10-CM

## 2014-04-22 NOTE — Telephone Encounter (Signed)
Pt left v/m requesting a rx for generic nexium sent to [facility]; spoke with Brittney at [facility]; pts ins will no longer cover namebrand Nexium; pt has available refills at [facility]; no further action needed. Pt notified and voiced understanding.

## 2014-05-11 ENCOUNTER — Ambulatory Visit
Admission: RE | Admit: 2014-05-11 | Discharge: 2014-05-11 | Disposition: A | Discharge status: Home / Self Care | Payer: BC Managed Care – PPO | Source: Ambulatory Visit | Attending: Obstetrics and Gynecology | Admitting: Obstetrics and Gynecology

## 2014-05-11 DIAGNOSIS — R928 Other abnormal and inconclusive findings on diagnostic imaging of breast: Secondary | ICD-10-CM

## 2014-07-13 ENCOUNTER — Telehealth: Payer: Self-pay

## 2014-07-13 MED ORDER — PANTOPRAZOLE SODIUM 40 MG PO TBEC: 40.0000 mg | DELAYED_RELEASE_TABLET | Freq: Two times a day (BID) | ORAL | Status: DC

## 2014-07-13 NOTE — Telephone Encounter (Signed)
Pt left v/m; pt said generic Nexium is $ 40.00/ month; pt request to retry a less expensive med to substitute for Nexium. [facility].

## 2014-07-13 NOTE — Telephone Encounter (Signed)
Spoke with Amy at [facility].  She'll call us back if there's an issue with insurance.

## 2014-07-13 NOTE — Telephone Encounter (Signed)
Sent.  If this isn't a lot cheaper, then she'll have to check on coverage for cheaper alternatives and notify us.   Thanks.

## 2014-07-13 NOTE — Telephone Encounter (Signed)
Tanya Cobb left v/m; pt is coming by [facility] today to pick up substitute med for Nexium (pt does not want generic nexium). Brand name Nexium is not covered by pts ins co. Tanya Cobb thinks pt has previously discussed with Dr Damita Dunnings.Please advise.

## 2014-07-13 NOTE — Addendum Note (Signed)
Addended by: Tonia Ghent on: 07/13/2014 02:04 PM   Modules accepted: Orders, Medications

## 2014-07-13 NOTE — Telephone Encounter (Signed)
She has been on other PPIs prev and didn't do as well as with nexium.  I would get generic nexium and not change the medicine.  If the generic nexium isn't covered, then notify me.

## 2014-10-22 HISTORY — PX: SHOULDER ARTHROSCOPY: SHX128

## 2014-12-31 ENCOUNTER — Ambulatory Visit: Payer: BC Managed Care – PPO | Admitting: Family Medicine

## 2015-01-05 ENCOUNTER — Ambulatory Visit (HOSPITAL_BASED_OUTPATIENT_CLINIC_OR_DEPARTMENT_OTHER): Admit: 2015-01-05 | Payer: Self-pay | Admitting: Orthopedic Surgery

## 2015-01-05 ENCOUNTER — Encounter (HOSPITAL_BASED_OUTPATIENT_CLINIC_OR_DEPARTMENT_OTHER): Payer: Self-pay

## 2015-01-05 SURGERY — SHOULDER ARTHROSCOPY WITH SUBACROMIAL DECOMPRESSION
Anesthesia: General | Laterality: Left

## 2015-05-05 ENCOUNTER — Telehealth: Payer: Self-pay | Admitting: Family Medicine

## 2015-05-05 NOTE — Telephone Encounter (Signed)
[facility] Patient Name: Tanya Cobb DOB: December 21, 1965 Initial Comment Caller states she has back pain, and moving to her right side hip. No other symptoms. Nurse Assessment Nurse: Markus Daft, RN, Sherre Poot Date/Time (Eastern Time): 05/05/2015 11:22:47 AM Confirm and document reason for call. If symptomatic, describe symptoms. ---Caller states she has lower right sided back pain, and moving to her right side hip. Rates pain now 3-4/10. Denies any abdominal pain/pain with urination/radiating to her legs. Started over a week ago, and felt like it was muscular as straightening up with standing is difficult. Has the patient traveled out of the country within the last 30 days? ---Not Applicable Does the patient require triage? ---Yes Related visit to physician within the last 2 weeks? ---No Does the PT have any chronic conditions? (i.e. diabetes, asthma, etc.) ---Yes List chronic conditions. ---chronic back pain off/on - never lasts very long, and most of the time w/o pain; GERD Did the patient indicate they were pregnant? ---No Guidelines Guideline Title Affirmed Question Affirmed Notes Back Pain Caused by a twisting, bending, or lifting injury (all triage questions negative) bending forward when pain happened; sitting for a long time makes the pain worse Final Disposition User [place], RN, Sherre Poot Comments Caller does state that her husband has shingles currently. - RN cautioned to watch for any possible rash, and be very careful as it is contagious requiring skin to skin contact with drainage. Caller verb. understanding. [email or web address] shingles/shingles-topic-overview Disagree/Comply: Comply

## 2015-05-05 NOTE — Telephone Encounter (Signed)
Noted. Thanks.

## 2015-05-12 ENCOUNTER — Encounter: Payer: Self-pay | Admitting: Family Medicine

## 2015-05-12 ENCOUNTER — Ambulatory Visit (INDEPENDENT_AMBULATORY_CARE_PROVIDER_SITE_OTHER): Payer: BC Managed Care – PPO | Admitting: Family Medicine

## 2015-05-12 VITALS — BP 98/64 | HR 74 | Temp 98.4°F | Wt 141.5 lb

## 2015-05-12 DIAGNOSIS — L989 Disorder of the skin and subcutaneous tissue, unspecified: Secondary | ICD-10-CM

## 2015-05-12 NOTE — Progress Notes (Signed)
Pre visit review using our clinic review tool, if applicable. No additional management support is needed unless otherwise documented below in the visit note.  1 week ago she called in about lower back pain.  Lower R side.   The pain got better in the meantime.    Today with a lesion on R hamstring area.  No FCNAVD.  Husband recently with shingles and she was worried about that.  Path/phys of shingles d/w pt.    Meds, vitals, and allergies reviewed.   ROS: See HPI.  Otherwise, noncontributory.  nad miniscule whitehead on the R posterior thigh- chaperoned exam.  No other rash noted.

## 2015-05-12 NOTE — Patient Instructions (Signed)
You should be fine, take care, glad to see you.

## 2015-05-13 DIAGNOSIS — L989 Disorder of the skin and subcutaneous tissue, unspecified: Secondary | ICD-10-CM | POA: Insufficient documentation

## 2015-05-13 NOTE — Assessment & Plan Note (Signed)
miniscule whitehead on the R posterior thigh, should resolve.  Not c/w shingles.  Patient reassured.  F/u prn.

## 2015-07-25 ENCOUNTER — Other Ambulatory Visit: Payer: Self-pay | Admitting: Obstetrics and Gynecology

## 2015-07-26 LAB — CYTOLOGY - PAP

## 2015-08-03 ENCOUNTER — Telehealth: Payer: Self-pay | Admitting: Family Medicine

## 2015-08-03 NOTE — Telephone Encounter (Signed)
Received faxed request from [facility] for refill on  Esomeprazole 40 mg Take 1 capsule by mouth twice a day Dispense   60   Refill  0  Last prescribed on 07/13/2014. Prescription is not on patient current medication list. Patient was last seen on 05/12/2015. No future appointment.

## 2015-08-04 NOTE — Telephone Encounter (Signed)
Thought she was on protonix. Please clarify with patient. Thanks.

## 2015-08-04 NOTE — Telephone Encounter (Signed)
Message left for patient to return my call.  

## 2015-08-05 MED ORDER — ESOMEPRAZOLE MAGNESIUM 40 MG PO CPDR: 40.0000 mg | DELAYED_RELEASE_CAPSULE | Freq: Two times a day (BID) | ORAL | Status: DC

## 2015-08-05 NOTE — Telephone Encounter (Signed)
Pt returned your call - please call back [phone] Thanks

## 2015-08-05 NOTE — Telephone Encounter (Signed)
Pt contacted office and states she takes esomeprazole

## 2015-08-05 NOTE — Telephone Encounter (Signed)
Lm on pts vm requesting a call back 

## 2015-08-05 NOTE — Telephone Encounter (Signed)
Sent. Thanks.   

## 2015-09-30 ENCOUNTER — Telehealth: Payer: Self-pay | Admitting: *Deleted

## 2015-09-30 NOTE — Telephone Encounter (Signed)
Faxed form received for PA for Esomeprazole 40 mg bid.  Form in Dr. Buckner Malta In [place].

## 2015-10-02 NOTE — Telephone Encounter (Signed)
I'll work on the hard copy.  Thanks.  

## 2015-10-06 NOTE — Telephone Encounter (Signed)
Pt is calling regarding this rx. She said the pharamcy has told her that they sent over 2 requests since last week. The pt is now completely out of the medication. You can reach her at [phone].

## 2015-10-07 NOTE — Telephone Encounter (Signed)
Form faxed

## 2015-10-07 NOTE — Telephone Encounter (Addendum)
The form is done.  Please send it in.   I've gotten one request and I wasn't able to get it until 10/02/15 since I was out of the office Friday. I've been out of work part of this week due to illness.   I apologize for the hold up.  It is also unreasonable for her insurance company to need this PA in the first place, given her history.

## 2015-11-07 ENCOUNTER — Encounter: Payer: Self-pay | Admitting: Gastroenterology

## 2015-11-25 ENCOUNTER — Ambulatory Visit (INDEPENDENT_AMBULATORY_CARE_PROVIDER_SITE_OTHER): Payer: BC Managed Care – PPO | Admitting: Internal Medicine

## 2015-11-25 ENCOUNTER — Ambulatory Visit (INDEPENDENT_AMBULATORY_CARE_PROVIDER_SITE_OTHER)
Admission: RE | Admit: 2015-11-25 | Discharge: 2015-11-25 | Disposition: A | Discharge status: Home / Self Care | Payer: BC Managed Care – PPO | Source: Ambulatory Visit | Attending: Internal Medicine | Admitting: Internal Medicine

## 2015-11-25 ENCOUNTER — Encounter: Payer: Self-pay | Admitting: Internal Medicine

## 2015-11-25 VITALS — BP 100/62 | HR 90 | Temp 98.1°F | Wt 143.0 lb

## 2015-11-25 DIAGNOSIS — R05 Cough: Secondary | ICD-10-CM | POA: Diagnosis not present

## 2015-11-25 DIAGNOSIS — R058 Other specified cough: Secondary | ICD-10-CM | POA: Insufficient documentation

## 2015-11-25 NOTE — Assessment & Plan Note (Addendum)
With systemic symptoms Will check CXR  Just tiny area of ?atelectasis in RML This is probably a mild flu Discussed supportive care Will treat with antibiotics if radiologist calls something

## 2015-11-25 NOTE — Progress Notes (Signed)
   Subjective:    Patient ID: Tanya Cobb, female    DOB: 01-08-1966, 50 y.o.   MRN: [id number]  HPI Here due to chest congestion Started suddenly about 10:30 yesterday am Last night had chills and sweats Dizziness today at work--so left  No fever Cough-- productive of thick colored sputum No head congestion --but may have some PND (has chronic rhinorrhea in cold weather) Felt SOB last night and some today---with activity  Current Outpatient Prescriptions on File Prior to Visit  Medication Sig Dispense Refill  . calcium carbonate (OS-CAL) 600 MG TABS Take 600 mg by mouth daily.    Marland Kitchen esomeprazole (NEXIUM) 40 MG capsule Take 1 capsule (40 mg total) by mouth 2 (two) times daily before a meal. 60 capsule 3  . Multiple Vitamin (MULTIVITAMIN) tablet Take 1 tablet by mouth daily.      . Omega-3 Fatty Acids (FISH OIL) 1000 MG CAPS Take 1 capsule by mouth daily.       No current facility-administered medications on file prior to visit.    No Known Allergies  Past Medical History  Diagnosis Date  . GERD (gastroesophageal reflux disease)     w/ LPR  . Cervical strain     Past Surgical History  Procedure Laterality Date  . Neck surgery  2008    Family History  Problem Relation Age of Onset  . Diabetes      [employer]  . Ovarian cancer Maternal Grandmother   . Colon cancer      [employer]  . Heart disease Maternal Grandmother     and MGF    Social History   Social History  . Marital Status: Married    Spouse Name: N/A  . Number of Children: 2  . Years of Education: N/A   Occupational History  . TEACHER     Assistant Principle   Social History Main Topics  . Smoking status: Never Smoker   . Smokeless tobacco: Never Used  . Alcohol Use: Yes     Comment: 1-2 drinks per day  . Drug Use: No  . Sexual Activity: Not on file   Other Topics Concern  . Not on file   Social History Narrative   Review of Systems Headache "coming"--took 3 ibuprofen this  AM Some cramping last night--no other aches Slight back pain No rash Nausea with dizziness this AM. No vomiting Appetite is okay    Objective:   Physical Exam  Constitutional: She appears well-nourished. No distress.  HENT:  Mouth/Throat: Oropharynx is clear and moist. No oropharyngeal exudate.  No sinus tenderness TMs normal Mild nasal congestion  Neck: Normal range of motion. Neck supple. No thyromegaly present.  Pulmonary/Chest: Effort normal and breath sounds normal. No respiratory distress. She has no wheezes. She has no rales.  Abdominal: Soft. There is no tenderness.  Lymphadenopathy:    She has no cervical adenopathy.  Skin: No rash noted.          Assessment & Plan:

## 2015-11-25 NOTE — Progress Notes (Signed)
Pre visit review using our clinic review tool, if applicable. No additional management support is needed unless otherwise documented below in the visit note. 

## 2015-12-27 ENCOUNTER — Encounter: Payer: Self-pay | Admitting: Gastroenterology

## 2015-12-27 ENCOUNTER — Ambulatory Visit (INDEPENDENT_AMBULATORY_CARE_PROVIDER_SITE_OTHER): Payer: BC Managed Care – PPO | Admitting: Gastroenterology

## 2015-12-27 VITALS — BP 90/70 | HR 76 | Ht 67.0 in | Wt 141.0 lb

## 2015-12-27 DIAGNOSIS — K219 Gastro-esophageal reflux disease without esophagitis: Secondary | ICD-10-CM

## 2015-12-27 DIAGNOSIS — R6889 Other general symptoms and signs: Secondary | ICD-10-CM

## 2015-12-27 DIAGNOSIS — R0989 Other specified symptoms and signs involving the circulatory and respiratory systems: Secondary | ICD-10-CM

## 2015-12-27 DIAGNOSIS — R198 Other specified symptoms and signs involving the digestive system and abdomen: Secondary | ICD-10-CM

## 2015-12-27 NOTE — Patient Instructions (Addendum)
Take esomeprazole twice daily every day.  We have sent the following medications to your pharmacy for you to pick up at your convenience:ranitidine 300 mg to take one tablet by mouth at bedtime.  Please schedule a follow up appointment with Dr. Erik Obey.   Please schedule a 2-3 month  follow up appt with Dr. Fuller Plan.  Thank you for choosing me and [facility].  Pricilla Riffle. Dagoberto Ligas., MD., Marval Regal

## 2015-12-27 NOTE — Progress Notes (Signed)
    History of Present Illness: This is a 50 year old female here for the evaluation of frequent throat clearing and vocal difficulties. She has a past history of GERD with LPR. I last evaluated her in July 2012. She has been evaluated by Dr. Erik Obey in the past. She is prescribed esomeprazole 40 mg twice daily but generally takes it only every morning. Her throat and vocal symptoms have not changed in several years. Her last EGD was in 2002 and it was unremarkable.  Review of Systems: Pertinent positive and negative review of systems were noted in the above HPI section. All other review of systems were otherwise negative.  Current Medications, Allergies, Past Medical History, Past Surgical History, Family History and Social History were reviewed in [employer] record.  Physical Exam: General: Well developed, well nourished, no acute distress Head: Normocephalic and atraumatic Eyes:  sclerae anicteric, EOMI Ears: Normal auditory acuity Mouth: No deformity or lesions Neck: Supple, no masses or thyromegaly Lungs: Clear throughout to auscultation Heart: Regular rate and rhythm; no murmurs, rubs or bruits Abdomen: Soft, non tender and non distended. No masses, hepatosplenomegaly or hernias noted. Normal Bowel sounds Musculoskeletal: Symmetrical with no gross deformities  Skin: No lesions on visible extremities Pulses:  Normal pulses noted Extremities: No clubbing, cyanosis, edema or deformities noted Neurological: Alert oriented x 4, grossly nonfocal Cervical Nodes:  No significant cervical adenopathy Inguinal Nodes: No significant inguinal adenopathy Psychological:  Alert and cooperative. Normal mood and affect  Assessment and Recommendations:  1. GERD with LPR presumably leading to throat clearing and vocal changes. Rule out other ENT disorders. I advised her to return to Dr. Jodi Marble for reevaluation. Take esomeprazole 40 mg twice daily, before breakfast and  dinner, and ranitidine 300 mg at bedtime for 3 months and then return for follow-up see if any component of her symptoms is reversible with more aggressive therapy for GERD. Consider repeat EGD at return office visit.  2. CRC screening, average risk. 10 year interval colonoscopy recommended in July 2022.

## 2016-01-13 ENCOUNTER — Other Ambulatory Visit: Payer: Self-pay | Admitting: *Deleted

## 2016-01-13 MED ORDER — ESOMEPRAZOLE MAGNESIUM 40 MG PO CPDR: 40.0000 mg | DELAYED_RELEASE_CAPSULE | Freq: Two times a day (BID) | ORAL | Status: DC

## 2016-04-09 ENCOUNTER — Other Ambulatory Visit: Payer: Self-pay | Admitting: Family Medicine

## 2016-04-09 NOTE — Telephone Encounter (Signed)
Routing to GI since they are managing pt's GI issues

## 2016-07-17 ENCOUNTER — Other Ambulatory Visit: Payer: Self-pay | Admitting: Family Medicine

## 2016-07-17 NOTE — Telephone Encounter (Signed)
Electronic refill request. Last office visit:   11/25/15 acute, otherwise not seen since July 2016.  Patient established with GI (Dr. Fuller Plan) in February 2017.  Last Filled:   60 capsule 2 04/09/2016  Please advise.

## 2016-07-18 NOTE — Telephone Encounter (Signed)
Patient notified as instructed by telephone and verbalized understanding. 

## 2016-07-18 NOTE — Telephone Encounter (Signed)
She needs to schedule f/u with Dr. Fuller Plan.  rx sent.  Thanks.

## 2016-08-23 ENCOUNTER — Other Ambulatory Visit: Payer: Self-pay | Admitting: Family Medicine

## 2016-10-23 LAB — HM MAMMOGRAPHY

## 2016-10-24 LAB — HM PAP SMEAR: HM Pap smear: NEGATIVE

## 2016-12-25 ENCOUNTER — Telehealth: Payer: Self-pay | Admitting: Family Medicine

## 2016-12-25 ENCOUNTER — Ambulatory Visit (INDEPENDENT_AMBULATORY_CARE_PROVIDER_SITE_OTHER): Payer: BC Managed Care – PPO | Admitting: Family Medicine

## 2016-12-25 VITALS — BP 112/75 | HR 72 | Temp 98.0°F | Wt 148.6 lb

## 2016-12-25 DIAGNOSIS — I491 Atrial premature depolarization: Secondary | ICD-10-CM

## 2016-12-25 NOTE — Assessment & Plan Note (Signed)
New problem. Patient experiencing PACs. History not suggestive of arrhythmia nor was it revealed on EKG. Patient had a lengthy discussion about watchful waiting versus treatment with beta blocker. Patient elected to wait at this time. She will contact me or her primary if these become more troublesome and she wants to start medication to help prevent/lessen PACs.

## 2016-12-25 NOTE — Telephone Encounter (Signed)
Pt has appt with Dr Marsa Aris 12/25/16 at 10:30.

## 2016-12-25 NOTE — Progress Notes (Signed)
Subjective:  Patient ID: Tanya Cobb, female    DOB: 06-01-1966  Age: 51 y.o. MRN: [id number]  CC: Palpitations  HPI:  51 year old female presents with the above complaint.  Patient states that for the past 2-3 days she's had frequent "extra beats". She states that she feels a "thud" in her chest. She states that her heart rate has been normal but she feels as if she is having frequent extra beats. These are quite bothersome for her. They cause some discomfort but she has no true chest pain. No shortness of breath. She does not recall an inciting factor, however she states that she has been under a great deal of stress at work. No increasing caffeine intake. No other associated symptoms. No other complaints or concerns at this time.  Social Hx   Social History   Social History  . Marital status: Married    Spouse name: N/A  . Number of children: 2  . 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 Smoking status: Never Smoker  . Smokeless tobacco: Never Used  . Alcohol use Yes     Comment: 1-2 drinks per day  . Drug use: No  . Sexual activity: Not on file   Other Topics Concern  . Not on file   Social History Narrative  . No narrative on file    Review of Systems  Constitutional: Negative.   Respiratory: Negative for shortness of breath.   Cardiovascular: Negative for chest pain.   Objective:  BP 112/75   Pulse 72   Temp 98 F (36.7 C) (Oral)   Wt 148 lb 9.6 oz (67.4 kg)   SpO2 99%   BMI 23.27 kg/m   BP/Weight 12/25/2016 [id number]  Systolic BP [id number] 90 [id number]  Diastolic BP 75 70 62  Wt. (Lbs) 148.6 141 143  BMI 23.27 22.08 22.39  Some recent data might be hidden   Physical Exam  Constitutional: She is oriented to person, place, and time. She appears well-developed. No distress.  Cardiovascular: Normal rate.  An irregular rhythm present.  Pulmonary/Chest:  Effort normal and breath sounds normal.  Neurological: She is alert and oriented to person, place, and time.  Psychiatric: She has a normal mood and affect.  Vitals reviewed.  Lab Results  Component Value Date   WBC 8.2 07/21/2012   HGB 14.0 07/21/2012   HCT 43.2 07/21/2012   PLT 183.0 07/21/2012   GLUCOSE 83 07/21/2012   ALT 11 07/21/2012   AST 23 07/21/2012   NA 138 07/21/2012   K 4.1 07/21/2012   CL 103 07/21/2012   CREATININE 1.0 07/21/2012   BUN 12 07/21/2012   CO2 29 07/21/2012   TSH 1.511 08/01/2001   EKG:  Date: 12/25/2016  EKG Time: 12:10 PM  Rate: 68   Rhythm: Normal sinus rhythm. Occasional PAC  Axis: Normal axis.  Intervals: Normal  ST&T Change: None.  Narrative Interpretation: Normal sinus rhythm with occasional PAC. No ST or T-wave abnormality.   Assessment & Plan:   Problem List Items Addressed This Visit    PAC (premature atrial contraction) - Primary    New problem. Patient experiencing PACs. History not suggestive of arrhythmia nor was it revealed on EKG. Patient had a lengthy discussion about watchful waiting versus treatment with beta blocker. Patient elected to wait at this time. She will contact me or her primary if  these become more troublesome and she wants to start medication to help prevent/lessen PACs.      Relevant Orders   EKG 12-Lead (Completed)     Follow-up: PRN  [place]

## 2016-12-25 NOTE — Telephone Encounter (Signed)
[facility]  Patient Name: Tanya Cobb  DOB: 1966/08/22    Initial Comment Caller states, she is having palpitations and hollowness in her chest. No chest pain. Verified    Nurse Assessment  Nurse: Wynetta Emery, RN, Baker Janus Date/Time Eilene Ghazi Time): 12/25/2016 9:55:59 AM  Confirm and document reason for call. If symptomatic, describe symptoms. ---[name] has a heart palpitation with hollow sound in chest no chest pain. has had it for years but more frequent has not stopped today  Does the patient have any new or worsening symptoms? ---Yes  Will a triage be completed? ---Yes  Related visit to physician within the last 2 weeks? ---No  Does the PT have any chronic conditions? (i.e. diabetes, asthma, etc.) ---Yes  List chronic conditions. ---heart condition  Is the patient pregnant or possibly pregnant? (Ask all females between the ages of 57-55) ---No  Is this a behavioral health or substance abuse call? ---No     Guidelines    Guideline Title Affirmed Question Affirmed Notes  Heart Rate and Heartbeat Questions [1] Skipped or extra beat(s) AND [2] occurs 4 or more times per minute    Final Disposition User   See Physician within 4 Hours (or PCP triage) Wynetta Emery, RN, Baker Janus    Comments  no availability with [facility] within 4 hours; Is very close to [employer] to be seen there; Thersa Salt, DO 1030am appt given 12/25/2016   Referrals  GO TO FACILITY UNDECIDED   Disagree/Comply: Comply

## 2016-12-25 NOTE — Patient Instructions (Signed)
Let us know if they persist and you would like treatment.  Take care  Dr. Lacinda Axon    Premature Atrial Contraction A premature atrial contraction [facility]) is a kind of irregular heartbeat (arrhythmia). It happens when the heart beats too early and then pauses before beating again. PACs are also called skipped heartbeats because they may make you feel like your heart is stopping for a second, even though the heart does not actually skip a beat. The heart has four areas, or chambers. Normally, electrical signals spread across the heart and make all the chambers beat together. During a PAC, the upper chambers of the heart (right atrium and left atrium) beat too early, before they have had time to fill with blood. The heartbeat pauses afterward so the heart can fill with blood for the next beat. What are the causes? The cause of this condition is often unknown. Sometimes it is caused by heart disease or injury to the heart. What increases the risk? This condition is more likely to develop in adults who are 53 years of age or older and in children. Episodes may be triggered by:  Caffeine.  Stress.  Tiredness.  Alcohol.  Smoking.  Stimulant drugs.  Heart disease. What are the signs or symptoms? Symptoms of this condition include:  A feeling that your heart skipped a beat. The first heartbeat after the "skipped" beat may feel more forceful.  A feeling that your heart is fluttering. How is this diagnosed? This condition is diagnosed based on:  Your symptoms.  A physical exam. Your health care provider may listen to your heart.  Tests to rule out other conditions, such as a test that records the electrical impulses of the heart and assesses heart health (electrocardiogram, or ECG). If you have an ECG, you may need to wear a portable ECG machine (Holter monitor) that records your heart beats for 24 hours or more. How is this treated?   Usually, treatment is not needed for this condition.  If you have episodes that happen often or if a cause is found, you may receive treatment for the underlying cause of your PACs. Follow these instructions at home: Lifestyle  Follow these instructions as told by your health care provider:  Do not use any products that contain nicotine or tobacco, such as cigarettes and e-cigarettes. If you need help quitting, ask your health care provider.  If caffeine triggers episodes, do not eat, drink, or use anything with caffeine in it.  If caffeine does not seem to trigger episodes, consume caffeine in moderation.  If alcohol triggers episodes of PAC, do not drink alcohol.  If alcohol does not seem to trigger episodes, limit alcohol intake to no more than 1 drink a day for nonpregnant women and 2 drinks a day for men. One drink equals 12 oz of beer, 5 oz of wine, or 1 oz of hard liquor.  Exercise regularly. Ask your health care provider what type of exercise is safe for you.  Find healthy ways to manage stress. Avoid stressful situations when possible.  Try to get at least 7-9 hours of sleep each night, or as much as recommended by your health care provider.  Do not use illegal drugs. General instructions   Take over-the-counter and prescription medicines only as told by your health care provider.  Keep all follow-up visits as told by your health care provider. This is important. Contact a health care provider if:  You feel your heart skipping beats more than once  a day.  Your heart skips beats and you feel dizzy, light-headed, or very tired. Get help right away if:  You have chest pain.  You have trouble breathing. This information is not intended to replace advice given to you by your health care provider. Make sure you discuss any questions you have with your health care provider. Document Released: 06/11/2014 Document Revised: 06/05/2016 Document Reviewed: 04/06/2016 Elsevier Interactive Patient Education  2017 [employer].

## 2016-12-25 NOTE — Progress Notes (Signed)
Pre visit review using our clinic review tool, if applicable. No additional management support is needed unless otherwise documented below in the visit note. 

## 2017-04-12 ENCOUNTER — Encounter: Payer: Self-pay | Admitting: Physician Assistant

## 2017-04-12 ENCOUNTER — Ambulatory Visit (INDEPENDENT_AMBULATORY_CARE_PROVIDER_SITE_OTHER): Payer: BC Managed Care – PPO

## 2017-04-12 ENCOUNTER — Ambulatory Visit: Payer: BC Managed Care – PPO | Admitting: Family Medicine

## 2017-04-12 ENCOUNTER — Ambulatory Visit (INDEPENDENT_AMBULATORY_CARE_PROVIDER_SITE_OTHER): Payer: BC Managed Care – PPO | Admitting: Physician Assistant

## 2017-04-12 VITALS — BP 112/70 | HR 77 | Temp 98.7°F | Ht 67.0 in | Wt 145.0 lb

## 2017-04-12 DIAGNOSIS — R05 Cough: Secondary | ICD-10-CM | POA: Diagnosis not present

## 2017-04-12 DIAGNOSIS — J40 Bronchitis, not specified as acute or chronic: Secondary | ICD-10-CM

## 2017-04-12 DIAGNOSIS — R059 Cough, unspecified: Secondary | ICD-10-CM

## 2017-04-12 MED ORDER — PREDNISONE 10 MG PO TABS: ORAL_TABLET | ORAL | 0 refills | Status: DC

## 2017-04-12 MED ORDER — DOXYCYCLINE HYCLATE 100 MG PO TABS: 100.0000 mg | ORAL_TABLET | Freq: Two times a day (BID) | ORAL | 0 refills | Status: DC

## 2017-04-12 NOTE — Progress Notes (Signed)
Tanya Cobb is a 51 y.o. female here for chronic cough x 5 weeks.  I acted as a Education administrator for [employer], PA-C [employer], LPN  History of Present Illness:   No chief complaint on file.   Cough  This is a chronic problem. Episode onset: x 5 weeks now, was seen at [facility] end of May and Dx with URI treated with Z-pak. The problem has been unchanged. The problem occurs constantly. The cough is productive of sputum (Clear). Associated symptoms include myalgias, postnasal drip and shortness of breath. Pertinent negatives include no fever, headaches, sore throat or wheezing. Associated symptoms comments: Chest congestion. The symptoms are aggravated by exercise. She has tried prescription cough suppressant (Z-pak and Flonase) for the symptoms. The treatment provided mild relief. Her past medical history is significant for bronchitis. Hx of Bronchitis in her 14's and walking pneumonia x 1   Non-smoker. She has had reduced appetite, but does feel well hydrated. Taking Hycodan at night, which she got at the urgent care, it is helping some but only at nighttime. She is leaving for [facility] tomorrow.   Past Medical History:  Diagnosis Date  . Cervical strain   . GERD (gastroesophageal reflux disease)    w/ LPR     Social History   Social History  . Marital status: Married    Spouse name: N/A  . Number of children: 2  . 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 Smoking status: Never Smoker  . Smokeless tobacco: Never Used  . Alcohol use Yes     Comment: 1-2 drinks per day  . Drug use: No  . Sexual activity: Not on file   Other Topics Concern  . Not on file   Social History Narrative  . No narrative on file    Past Surgical History:  Procedure Laterality Date  . NECK SURGERY  2008  . SHOULDER ARTHROSCOPY  2016    Family History  Problem Relation Age of Onset   . Diabetes Unknown        [employer]  . Ovarian cancer Maternal Grandmother   . Colon cancer Unknown        Great Grandmother  . Heart disease Maternal Grandmother        and MGF    No Known Allergies  Current Medications:   Current Outpatient Prescriptions:  .  calcium carbonate (OS-CAL) 600 MG TABS, Take 600 mg by mouth daily., Disp: , Rfl:  .  esomeprazole (NEXIUM) 40 MG capsule, TAKE ONE (1) CAPSULE BY MOUTH 2 TIMES DAILY BEFORE A MEAL, Disp: 60 capsule, Rfl: 5 .  Multiple Vitamin (MULTIVITAMIN) tablet, Take 1 tablet by mouth daily.  , Disp: , Rfl:  .  Omega-3 Fatty Acids (FISH OIL) 1000 MG CAPS, Take 1 capsule by mouth daily.  , Disp: , Rfl:  .  doxycycline (VIBRA-TABS) 100 MG tablet, Take 1 tablet (100 mg total) by mouth 2 (two) times daily., Disp: 20 tablet, Rfl: 0 .  predniSONE (DELTASONE) 10 MG tablet, Take 3 tabs x 3 days, then 2 tabs x 3 days, then 1 tab x 3 days., Disp: 18 tablet, Rfl: 0   Review of Systems:   Review of Systems  Constitutional: Negative for fever.  HENT: Positive for postnasal drip. Negative for sore throat.   Respiratory: Positive for cough and shortness of breath. Negative for wheezing.  Musculoskeletal: Positive for myalgias.  Neurological: Negative for headaches.    Vitals:   Vitals:   04/12/17 1139  BP: 112/70  Pulse: 77  Temp: 98.7 F (37.1 C)  TempSrc: Oral  SpO2: 97%  Weight: 145 lb (65.8 kg)  Height: 5\' 7"  (1.702 m)     Body mass index is 22.71 kg/m.  Physical Exam:   Physical Exam  Constitutional: She appears well-developed. She is cooperative.  Non-toxic appearance. She does not have a sickly appearance. She does not appear ill. No distress.  HENT:  Head: Normocephalic and atraumatic.  Right Ear: Tympanic membrane, external ear and ear canal normal. Tympanic membrane is not erythematous, not retracted and not bulging.  Left Ear: Tympanic membrane, external ear and ear canal normal. Tympanic membrane is not erythematous,  not retracted and not bulging.  Nose: Mucosal edema and rhinorrhea present. Right sinus exhibits no maxillary sinus tenderness and no frontal sinus tenderness. Left sinus exhibits no maxillary sinus tenderness and no frontal sinus tenderness.  Mouth/Throat: Uvula is midline. Posterior oropharyngeal erythema present. No posterior oropharyngeal edema. Tonsils are 0 on the right. Tonsils are 0 on the left. No tonsillar exudate.  Eyes: Conjunctivae and lids are normal.  Neck: Trachea normal.  Cardiovascular: Normal rate, regular rhythm, S1 normal, S2 normal and normal heart sounds.   Pulmonary/Chest: Effort normal and breath sounds normal. She has no decreased breath sounds. She has no wheezes. She has no rhonchi. She has no rales.  Dry cough throughout exam  Lymphadenopathy:    She has no cervical adenopathy.  Neurological: She is alert.  Skin: Skin is warm, dry and intact.  Psychiatric: She has a normal mood and affect. Her speech is normal and behavior is normal.  Nursing note and vitals reviewed.  CLINICAL DATA:  Cough for 4-5 weeks  EXAM: CHEST  2 VIEW  COMPARISON:  11/25/2015  FINDINGS: The heart size and mediastinal contours are within normal limits. Both lungs are clear. The visualized skeletal structures are unremarkable. Postsurgical changes are noted in the cervical spine. Bilateral breast implants are noted.  IMPRESSION: No active cardiopulmonary disease.  Assessment and Plan:    Diagnoses and all orders for this visit:  Bronchitis -     DG Chest 2 View; Future  Other orders -     predniSONE (DELTASONE) 10 MG tablet; Take 3 tabs x 3 days, then 2 tabs x 3 days, then 1 tab x 3 days. -     doxycycline (VIBRA-TABS) 100 MG tablet; Take 1 tablet (100 mg total) by mouth 2 (two) times daily.   Chest xray without evidence of PNA. Will treat with steroids per orders and second round of antibiotics, doxycycline. Continue Hycodan as needed. Follow-up with [place] if there are  any worsening symptoms or concerns. Patient agreeable to plan.  . Reviewed expectations re: course of current medical issues. . Discussed self-management of symptoms. . Outlined signs and symptoms indicating need for more acute intervention. . Patient verbalized understanding and all questions were answered. . See orders for this visit as documented in the electronic medical record. . Patient received an After-Visit Summary.  CMA or LPN served as scribe during this visit. History, Physical, and Plan performed by medical provider. Documentation and orders reviewed and attested to.  Inda Coke, PA-C

## 2017-04-12 NOTE — Patient Instructions (Addendum)
It was great meeting you!  For the prednisone, take 3 tabs daily for the first 3 days, take 2 tabs daily for the next 3 days and then 1 tab daily for the next 3 days.  Start the doxycycline, take with food.  Please let us know if your symptoms worsen or persist.

## 2017-05-20 ENCOUNTER — Telehealth: Payer: Self-pay | Admitting: Family Medicine

## 2017-05-21 NOTE — Telephone Encounter (Signed)
Pt left v/m and requested that we get info from GI appt on 12/27/15 so pt will not need to come in for appt. Last saw Dr Damita Dunnings for f/u 11/25/13. Unable to reach pt at any contact #; left v/m requesting cb to schedule an appt with Dr Damita Dunnings.

## 2017-05-21 NOTE — Telephone Encounter (Signed)
Pt called back and has had CPX with GYN and has see GI and wants Dr Damita Dunnings to get info from those 2 doctors and fill med without an appt. I tried to explain to pt for Dr Damita Dunnings to prescribe med he would need to see pt. Pt will ck with GYN to see if he will prescribe med and if not pt will cb to schedule appt. FYI to Dr Damita Dunnings.

## 2017-05-22 NOTE — Telephone Encounter (Signed)
Patient notified as instructed by telephone and verbalized understanding. Patient stated that she does not think that she needs a physical because she thinks that this would be a  duplication of what she has had done. Advised patient the need for a physical with her PCP and it would be poor medical practice to prescribe medication without seeing her.  Advised patient that Dr. Damita Dunnings would not duplicate anything that she has had done. Patient stated that she is going to call her GYN and see if he will prescribe the medication and if not she will call back and schedule the appointment.

## 2017-05-22 NOTE — Telephone Encounter (Signed)
It looks like she has not been seen in the clinic in over a year. I have not seen the patient for need a longer period of time. She is due for a physical. I don't have much to offer since I have not seen the patient in such a long period of time. Thanks.

## 2017-05-22 NOTE — Telephone Encounter (Signed)
Noted  

## 2017-05-29 ENCOUNTER — Ambulatory Visit: Payer: BC Managed Care – PPO | Admitting: Family Medicine

## 2017-05-31 ENCOUNTER — Ambulatory Visit (INDEPENDENT_AMBULATORY_CARE_PROVIDER_SITE_OTHER): Payer: BC Managed Care – PPO | Admitting: Family Medicine

## 2017-05-31 ENCOUNTER — Encounter: Payer: Self-pay | Admitting: Family Medicine

## 2017-05-31 DIAGNOSIS — H6982 Other specified disorders of Eustachian tube, left ear: Secondary | ICD-10-CM | POA: Diagnosis not present

## 2017-05-31 DIAGNOSIS — H6980 Other specified disorders of Eustachian tube, unspecified ear: Secondary | ICD-10-CM | POA: Insufficient documentation

## 2017-05-31 MED ORDER — FLUTICASONE PROPIONATE 50 MCG/ACT NA SUSP: 2.0000 | Freq: Every day | NASAL | Status: DC

## 2017-05-31 NOTE — Assessment & Plan Note (Signed)
Continue valsalva and use flonase, she'll f/u with ENT if not bette.  She agrees.  See AVS.

## 2017-05-31 NOTE — Progress Notes (Signed)
She had a cough/cold in June.  She had fluid in L ear, never got getter.  In July she had severe pain with a plane trip.  She felt "bubbles" in the L ear.  Some pain yesterday, no pain today.  No FCNAVD.  She has been doing valsalva in the meantime.    She has hot flashed attributed to menopause.  Has hearing aid in L ear at baseline.    Meds, vitals, and allergies reviewed.   ROS: Per HPI unless specifically indicated in ROS section   Nad R TM wnl, L TM with SOM with slow movement on valsalva.  Neither TM with erythema.  Nasal exam slightly stuffy OP wnl Neck supple no LA Using hearing aid in L ear at baseline.

## 2017-05-31 NOTE — Patient Instructions (Signed)
Use flonase and keep trying to pop your ears.  Take care.  Glad to see you.  Update the ENT clinic if not better in about 10 days.

## 2017-06-04 ENCOUNTER — Encounter: Payer: Self-pay | Admitting: Family Medicine

## 2017-11-07 LAB — HM PAP SMEAR: HM PAP: NEGATIVE

## 2017-11-08 ENCOUNTER — Other Ambulatory Visit: Payer: Self-pay | Admitting: Obstetrics and Gynecology

## 2017-11-08 DIAGNOSIS — R928 Other abnormal and inconclusive findings on diagnostic imaging of breast: Secondary | ICD-10-CM

## 2017-11-21 ENCOUNTER — Ambulatory Visit: Payer: BC Managed Care – PPO | Admitting: Family Medicine

## 2017-11-21 ENCOUNTER — Encounter: Payer: Self-pay | Admitting: Family Medicine

## 2017-11-21 DIAGNOSIS — Z659 Problem related to unspecified psychosocial circumstances: Secondary | ICD-10-CM

## 2017-11-21 MED ORDER — VENLAFAXINE HCL ER 37.5 MG PO CP24: 37.5000 mg | ORAL_CAPSULE | Freq: Every day | ORAL | 1 refills | Status: DC

## 2017-11-21 NOTE — Progress Notes (Signed)
Anxiety/PACs.  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  About 1 year ago she had a "hollow" feeling in her chest.  Seen at outside clinic, EKG with PAC noted.    Step father with dementia, dx >5 years ago Father has dementia <1 year since dx She has a lot going on, d/w pt.  "I'm not dealing as well with it as I used to."    She has fatigue noted.  She has noted more episodes of skipped beats, under periods of high stress.  No runs of sustained tachycardia noted by patient.   Less patient, less tolerant overall in dealing with stressors.   Not lightheaded, BP similar to prev.  D/w pt.   She is in menopause with hot flashes.  She sometimes wakes from that.  Not on meds for menopause.    No SI/HI.  Etoh "a few on the weekend." Nonsmoker.  Safe at home.  She can still run 2.5 miles in about 30 minutes.    Meds, vitals, and allergies reviewed.   ROS: Per HPI unless specifically indicated in ROS section   GEN: nad, alert and oriented, speech and judgement wnl.  HEENT: mucous membranes moist NECK: supple w/o LA CV: rrr PULM: ctab, no inc wob ABD: soft, +bs EXT: no edema

## 2017-11-21 NOTE — Patient Instructions (Addendum)
Try venlafaxine daily and see if that helps with anxiety/PACs and with hot flashes.  Update me in about 2 weeks, sooner if needed.  Keep exercising.  Check on counseling options in your network.  Support groups may help.  We'll request your records in the meantime.  Take care.  Glad to see you.  Thanks for your effort.

## 2017-11-22 ENCOUNTER — Telehealth: Payer: Self-pay | Admitting: Family Medicine

## 2017-11-22 DIAGNOSIS — Z659 Problem related to unspecified psychosocial circumstances: Secondary | ICD-10-CM | POA: Insufficient documentation

## 2017-11-22 MED ORDER — VENLAFAXINE HCL ER 37.5 MG PO CP24: 37.5000 mg | ORAL_CAPSULE | Freq: Every day | ORAL | Status: DC

## 2017-11-22 NOTE — Telephone Encounter (Signed)
There is a sedation caution on the amitriptyline at baseline, even prior to considering any extra alcohol.  There is an alcohol caution ([id number] etoh) with all of the meds that I can think of for her situation- that is a routine issue.  If she's worried then I can send in the amitriptyline rx but she'll need to start at a low dose and gradually increase the dose.  She would only take it at night.  I don't know how effective it would be for hot flashes.   I'm okay sending it in. Let me know.  Thanks.

## 2017-11-22 NOTE — Telephone Encounter (Signed)
Noted. Thanks. Med list updated.  Await update from patient.

## 2017-11-22 NOTE — Telephone Encounter (Signed)
I spoke with pt who is at work and could not talk freely; pt has not started venlafaxine due to reading about potential side effects. Pt prefers to switch med to amitriptyline.[facility]. Pt request cb when med sent to pharmacy due to [facility]; pt does not want to have [facility].

## 2017-11-22 NOTE — Telephone Encounter (Addendum)
Patient advised and elects to use the venlafaxine as prescribed unless there are problems in the future.

## 2017-11-22 NOTE — Telephone Encounter (Signed)
Copied from [place]. Topic: Quick Communication - See Telephone Encounter >> Nov 22, 2017 10:45 AM Percell Belt A wrote: CRM for notification. See Telephone encounter for: pt called in and is concerned about he side effects of the venlafaxine XR (EFFEXOR XR) 37.5 MG 24 hr capsule [[id number]].  She would like to talk to cma about this med and would like to switched to Amitriptyline?  Best number -[phone] 11/22/17.

## 2017-11-22 NOTE — Telephone Encounter (Signed)
Patient states that she is a person who likes to leave work on Friday afternoons and go get a few beers or a glass of wine and there are precautions to that effect on the Venlafaxaine.  Patient says if this is not an issue with the Amitriptyline, that is her reason for switching.  Otherwise, she will move forward with the Venlafaxaine.

## 2017-11-22 NOTE — Assessment & Plan Note (Signed)
Job and family stressors, likely causing some PACs.  Minimal caffeine.  In menopause with hot flashes.   effexor may help with stress response and hot flashes.  Avoid beta blocker for now given her lower baseline BP.  Keep exercising.  She'll check on counseling options in her network.  Support groups may help.  We'll request your records in the meantime from GYN clinic.  Recently had labs done.   Okay for outpatient f/u.  >25 minutes spent in face to face time with patient, >50% spent in counselling or coordination of care.

## 2017-11-22 NOTE — Telephone Encounter (Signed)
Has she read about the side effects on amitriptyline?   The venlafaxine is likely safer and likely more effective. I check on switching if she prefers but the venlafaxine may be a better option.

## 2017-12-03 ENCOUNTER — Ambulatory Visit
Admission: RE | Admit: 2017-12-03 | Discharge: 2017-12-03 | Disposition: A | Discharge status: Home / Self Care | Payer: BC Managed Care – PPO | Source: Ambulatory Visit | Attending: Obstetrics and Gynecology | Admitting: Obstetrics and Gynecology

## 2017-12-03 ENCOUNTER — Other Ambulatory Visit: Payer: Self-pay | Admitting: Obstetrics and Gynecology

## 2017-12-03 DIAGNOSIS — R928 Other abnormal and inconclusive findings on diagnostic imaging of breast: Secondary | ICD-10-CM

## 2017-12-09 ENCOUNTER — Ambulatory Visit
Admission: RE | Admit: 2017-12-09 | Discharge: 2017-12-09 | Disposition: A | Discharge status: Home / Self Care | Payer: BC Managed Care – PPO | Source: Ambulatory Visit | Attending: Obstetrics and Gynecology | Admitting: Obstetrics and Gynecology

## 2017-12-09 DIAGNOSIS — R928 Other abnormal and inconclusive findings on diagnostic imaging of breast: Secondary | ICD-10-CM

## 2017-12-18 ENCOUNTER — Ambulatory Visit: Payer: BC Managed Care – PPO | Admitting: Gastroenterology

## 2017-12-20 DIAGNOSIS — C50919 Malignant neoplasm of unspecified site of unspecified female breast: Secondary | ICD-10-CM

## 2017-12-20 HISTORY — DX: Malignant neoplasm of unspecified site of unspecified female breast: C50.919

## 2017-12-20 HISTORY — PX: BREAST LUMPECTOMY: SHX2

## 2018-01-07 ENCOUNTER — Encounter: Payer: Self-pay | Admitting: Family Medicine

## 2018-01-08 ENCOUNTER — Telehealth: Payer: Self-pay | Admitting: Family Medicine

## 2018-01-08 NOTE — Telephone Encounter (Signed)
Called pt and husband, LMOVM with each, mainly to let patient know that I was wishing her well with her treatment at [facility].  I'll await notes from [facility].  I appreciate the help of all involved.

## 2018-02-11 ENCOUNTER — Other Ambulatory Visit: Payer: Self-pay | Admitting: Family Medicine

## 2018-02-11 NOTE — Telephone Encounter (Signed)
Electronic refill request. Venlafaxine Last office visit:   11/21/2017 Last Filled:   11/22/2017  No Print Please advise.

## 2018-02-12 NOTE — Telephone Encounter (Signed)
Sent.  Please get update on patient. I called pt and husband last month, LMOVM with each, mainly to let patient know that I was wishing her well with her treatment at [facility].  Thanks.

## 2018-02-12 NOTE — Telephone Encounter (Signed)
Left message on voicemail for patient to call back. 

## 2018-02-13 NOTE — Telephone Encounter (Signed)
Left detailed message on voicemail.  

## 2018-02-14 NOTE — Telephone Encounter (Signed)
Spoke with patient who says she had surgery last week with negative margins and will start radiation on May 2nd.  She is glad to be moving in the right direction.  I gave her your regards and good luck with treatments.

## 2018-02-16 NOTE — Telephone Encounter (Signed)
Thanks.  Noted.  Will await consult notes.

## 2018-05-21 ENCOUNTER — Ambulatory Visit: Payer: BC Managed Care – PPO | Admitting: Gastroenterology

## 2018-05-21 ENCOUNTER — Encounter: Payer: Self-pay | Admitting: Gastroenterology

## 2018-05-21 VITALS — BP 100/80 | HR 72 | Ht 67.0 in | Wt 147.0 lb

## 2018-05-21 DIAGNOSIS — K219 Gastro-esophageal reflux disease without esophagitis: Secondary | ICD-10-CM | POA: Diagnosis not present

## 2018-05-21 DIAGNOSIS — Z1212 Encounter for screening for malignant neoplasm of rectum: Secondary | ICD-10-CM

## 2018-05-21 DIAGNOSIS — Z1211 Encounter for screening for malignant neoplasm of colon: Secondary | ICD-10-CM | POA: Diagnosis not present

## 2018-05-21 MED ORDER — ESOMEPRAZOLE MAGNESIUM 40 MG PO CPDR: DELAYED_RELEASE_CAPSULE | ORAL | 11 refills | Status: DC

## 2018-05-21 NOTE — Patient Instructions (Signed)
We have sent the following medications to your pharmacy for you to pick up at your convenience: Nexium.    You have been scheduled for an endoscopy. Please follow written instructions given to you at your visit today. If you use inhalers (even only as needed), please bring them with you on the day of your procedure. Your physician has requested that you go to www.startemmi.com and enter the access code given to you at your visit today. This web site gives a general overview about your procedure. However, you should still follow specific instructions given to you by our office regarding your preparation for the procedure.  Thank you for choosing me and [facility].  Malcolm T. Stark, Jr., MD., FACG  

## 2018-05-21 NOTE — Progress Notes (Addendum)
    History of Present Illness: This is a 52 year old female with GERD and LPR. Last office visit was in 12/2015.  She states reflux symptoms including LPR symptoms are under good control on Nexium 40 mg daily.  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   Current Medications, Allergies, Past Medical History, Past Surgical History, Family History and Social History were reviewed in [employer] record.  Physical Exam: General: Well developed, well nourished, no acute distress Head: Normocephalic and atraumatic Eyes:  sclerae anicteric, EOMI Ears: Normal auditory acuity Mouth: No deformity or lesions Lungs: Clear throughout to auscultation Heart: Regular rate and rhythm; no murmurs, rubs or bruits Abdomen: Soft, non tender and non distended. No masses, hepatosplenomegaly or hernias noted. Normal Bowel sounds Rectal: Not done Musculoskeletal: Symmetrical with no gross deformities  Pulses:  Normal pulses noted Extremities: No clubbing, cyanosis, edema or deformities noted Neurological: Alert oriented x 4, grossly nonfocal Psychological:  Alert and cooperative. Normal mood and affect  Assessment and Recommendations:  1.  Chronic GERD with LPR. R/O esophagitis, Barrett's.  Continue Nexium 40 mg p.o. every morning.  Follow standard antireflux measures.  Schedule EGD. The risks (including bleeding, perforation, infection, missed lesions, medication reactions and possible hospitalization or surgery if complications occur), benefits, and alternatives to endoscopy with possible biopsy and possible dilation were discussed with the patient and they consent to proceed.   2.  CRC screening, average risk.  Colonoscopy in July 2012 was normal.  A 10-year interval screening colonoscopy is recommended in July 2022.

## 2018-06-03 ENCOUNTER — Encounter: Payer: Self-pay | Admitting: Family Medicine

## 2018-06-03 ENCOUNTER — Ambulatory Visit: Payer: BC Managed Care – PPO | Admitting: Family Medicine

## 2018-06-03 VITALS — BP 100/70 | HR 74 | Temp 98.6°F | Ht 67.0 in | Wt 147.0 lb

## 2018-06-03 DIAGNOSIS — I491 Atrial premature depolarization: Secondary | ICD-10-CM | POA: Diagnosis not present

## 2018-06-03 LAB — CBC WITH DIFFERENTIAL/PLATELET
Basophils Absolute: 0.1 10*3/uL (ref 0.0–0.1)
Basophils Relative: 1.2 % (ref 0.0–3.0)
EOS PCT: 2.2 % (ref 0.0–5.0)
Eosinophils Absolute: 0.1 10*3/uL (ref 0.0–0.7)
HEMATOCRIT: 38.8 % (ref 36.0–46.0)
HEMOGLOBIN: 13.3 g/dL (ref 12.0–15.0)
LYMPHS PCT: 24.2 % (ref 12.0–46.0)
Lymphs Abs: 1.4 10*3/uL (ref 0.7–4.0)
MCHC: 34.3 g/dL (ref 30.0–36.0)
MCV: 90 fl (ref 78.0–100.0)
MONO ABS: 0.5 10*3/uL (ref 0.1–1.0)
MONOS PCT: 7.8 % (ref 3.0–12.0)
Neutro Abs: 3.7 10*3/uL (ref 1.4–7.7)
Neutrophils Relative %: 64.6 % (ref 43.0–77.0)
Platelets: 186 10*3/uL (ref 150.0–400.0)
RBC: 4.31 Mil/uL (ref 3.87–5.11)
RDW: 13.4 % (ref 11.5–15.5)
WBC: 5.8 10*3/uL (ref 4.0–10.5)

## 2018-06-03 LAB — COMPREHENSIVE METABOLIC PANEL
ALBUMIN: 4.2 g/dL (ref 3.5–5.2)
ALT: 10 U/L (ref 0–35)
AST: 19 U/L (ref 0–37)
Alkaline Phosphatase: 44 U/L (ref 39–117)
BUN: 12 mg/dL (ref 6–23)
CALCIUM: 9.4 mg/dL (ref 8.4–10.5)
CHLORIDE: 104 meq/L (ref 96–112)
CO2: 31 mEq/L (ref 19–32)
Creatinine, Ser: 0.85 mg/dL (ref 0.40–1.20)
GFR: 74.06 mL/min (ref >60.00)
Glucose, Bld: 95 mg/dL (ref 70–99)
POTASSIUM: 4.1 meq/L (ref 3.5–5.1)
SODIUM: 141 meq/L (ref 135–145)
TOTAL PROTEIN: 6.9 g/dL (ref 6.0–8.3)
Total Bilirubin: 0.5 mg/dL (ref 0.2–1.2)

## 2018-06-03 LAB — TSH: TSH: 2.04 u[IU]/mL (ref 0.35–4.50)

## 2018-06-03 MED ORDER — GABAPENTIN 100 MG PO CAPS: 100.0000 mg | ORAL_CAPSULE | Freq: Every day | ORAL | 3 refills | Status: DC

## 2018-06-03 NOTE — Patient Instructions (Addendum)
Go to the lab on the way out.  We'll contact you with your lab report. We will call about your referral.  Rosaria Ferries or Azalee Course will call you if you don't see one of them on the way out.  If you have chest pain, persistent symptoms or lightheadedness then go to the ER.   Take care.  Glad to see you.

## 2018-06-03 NOTE — Progress Notes (Signed)
She isn't lightheaded.    She is on tamoxifen per onc clinic.  I'll defer.  She had good care at [facility].  She has nightsweats on tamoxifen.  Taking gabapentin for the sweats.  She is trying to put up with the night sweats as is.  She thought she was having more PACs.  She'll feel a skipped beat with a "hollow feeling" but not pain in the thorax.  She can have sx up to every 3rd beat.  She had been doing well until last week when symptoms restarted.  No syncope.  She can have the sensation where she wants to take a deep breath with an event- but still exercising at baseline w/o SOB.  No CP.    Recheck BP 100/70.    Prev EKG with PAC noted.   PMH and SH reviewed  ROS: Per HPI unless specifically indicated in ROS section   Meds, vitals, and allergies reviewed.   GEN: nad, alert and oriented HEENT: mucous membranes moist NECK: supple w/o LA CV: rrr.  occ ectopy noted on exam PULM: ctab, no inc wob ABD: soft, +bs EXT: no edema SKIN: no acute rash  EKG with normal sinus rhythm noted without acute changes.  It is noted that she clearly had ectopy audible during auscultation but she did not apparently have those same events on the EKG.

## 2018-06-05 NOTE — Assessment & Plan Note (Signed)
Recheck blood pressure 100/70.  Previous EKG with PAC noted. EKG from today with normal sinus rhythm noted without acute changes.  It is noted that she clearly had ectopy audible during auscultation but she did not apparently have those same events on the EKG.  Refer to cardiology, routine cautions given, see after visit summary.  Check routine labs today.  Still okay for outpatient follow-up.  I did not start a beta-blocker given her relatively low blood pressure, which is her baseline blood pressure. She agrees with plan. >25 minutes spent in face to face time with patient, >50% spent in counselling or coordination of care.

## 2018-06-10 ENCOUNTER — Ambulatory Visit: Payer: BC Managed Care – PPO | Admitting: Cardiovascular Disease

## 2018-06-11 ENCOUNTER — Ambulatory Visit: Payer: BC Managed Care – PPO | Admitting: Cardiovascular Disease

## 2018-06-11 ENCOUNTER — Encounter: Payer: Self-pay | Admitting: Cardiovascular Disease

## 2018-06-11 VITALS — BP 112/76 | HR 75 | Ht 67.0 in | Wt 146.0 lb

## 2018-06-11 DIAGNOSIS — I491 Atrial premature depolarization: Secondary | ICD-10-CM | POA: Diagnosis not present

## 2018-06-11 DIAGNOSIS — R002 Palpitations: Secondary | ICD-10-CM | POA: Diagnosis not present

## 2018-06-11 NOTE — Assessment & Plan Note (Signed)
Ms. Lovin was referred to me by Dr. Damita Dunnings for PACs.  She had palpitations last year which resolved and recurred again on August 1.  They have occurred daily.  She feels them but otherwise is asymptomatic.  Her TSH was normal.  She drinks 1 cup of coffee a day.  I am going to check a 2-week event monitor and a 2D echo to further evaluate.

## 2018-06-11 NOTE — Progress Notes (Signed)
06/11/2018 Tanya Cobb   [birth year]  [id number]  Primary Physician Tonia Ghent, MD Primary Cardiologist: Lorretta Harp MD Lupe Carney, [place]  HPI:  Tanya Cobb is a 52 y.o. 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  She was referred by Dr. Elsie Stain for cardiac evaluation because of symptomatic PACs.  He basically has no risk factors.  She is never had a heart attack or stroke although she does have family members that have had A. fib.  She does not smoke.  She drinks 6 to 7 glasses of either beer or wine a week and 1 cup of coffee in the morning.  She walks frequently and lifts weights.  She is a breast cancer survivor and was diagnosed in March and had surgery.  She finished radiation therapy in June.  She had palpitations last year which resolved spontaneously recurred again earlier this month.  Is been occurring daily.  TSH was checked which was normal.  She is not under unusual stress.   Current Meds  Medication Sig  . calcium carbonate (OS-CAL) 600 MG TABS Take 600 mg by mouth daily.  Marland Kitchen esomeprazole (NEXIUM) 40 MG capsule TAKE ONE (1) CAPSULE BY MOUTH 2 TIMES DAILY BEFORE A MEAL  . gabapentin (NEURONTIN) 100 MG capsule Take 1 capsule (100 mg total) by mouth at bedtime.  . Multiple Vitamin (MULTIVITAMIN) tablet Take 1 tablet by mouth daily.    . Omega-3 Fatty Acids (FISH OIL) 1000 MG CAPS Take 1 capsule by mouth daily.    . pentoxifylline (TRENTAL) 400 MG CR tablet Take 1 tablet by mouth 3 (three) times daily.  . Probiotic Product (PROBIOTIC-10 PO) Take by mouth daily.  . tamoxifen (NOLVADEX) 20 MG tablet Take 1 tablet by mouth daily.  . vitamin E 400 UNIT capsule Take 1 capsule by mouth daily.     No Known Allergies  Social History   Socioeconomic History  . Marital status: Married    Spouse name: Not on file  . Number of children: 2  . Years of education: Not on  file  . Highest education level: Not on file  Occupational History  . Occupation: TEACHER    Employer: [employer]    Comment: Assistant Principle  Social Needs  . Financial resource strain: Not on file  . Food insecurity:    Worry: Not on file    Inability: Not on file  . Transportation needs:    Medical: Not on file    Non-medical: Not on file  Tobacco Use  . Smoking status: Never Smoker  . Smokeless tobacco: Never Used  Substance and Sexual Activity  . Alcohol use: Yes    Comment: 1-2 drinks per day  . Drug use: No  . Sexual activity: Not on file  Lifestyle  . Physical activity:    Days per week: Not on file    Minutes per session: Not on file  . Stress: Not on file  Relationships  . Social connections:    Talks on phone: Not on file    Gets together: Not on file    Attends religious service: Not on file    Active member of club or organization: Not on file    Attends meetings of clubs or organizations: Not on file    Relationship status: Not on file  . Intimate partner violence:    Fear of current  or ex partner: Not on file    Emotionally abused: Not on file    Physically abused: Not on file    Forced sexual activity: Not on file  Other Topics Concern  . 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       Review of Systems: General: negative for chills, fever, night sweats or weight changes.  Cardiovascular: negative for chest pain, dyspnea on exertion, edema, orthopnea, palpitations, paroxysmal nocturnal dyspnea or shortness of breath Dermatological: negative for rash Respiratory: negative for cough or wheezing Urologic: negative for hematuria Abdominal: negative for nausea, vomiting, diarrhea, bright red blood per rectum, melena, or hematemesis Neurologic: negative for visual changes, syncope, or dizziness All other systems reviewed and are otherwise negative except as noted above.    Blood  pressure 112/76, pulse 75, height 5\' 7"  (1.702 m), weight 146 lb (66.2 kg).  General appearance: alert and no distress Neck: no adenopathy, no carotid bruit, no JVD, supple, symmetrical, trachea midline and thyroid not enlarged, symmetric, no tenderness/mass/nodules Lungs: clear to auscultation bilaterally Heart: regular rate and rhythm, S1, S2 normal, no murmur, click, rub or gallop Extremities: extremities normal, atraumatic, no cyanosis or edema Pulses: 2+ and symmetric Skin: Skin color, texture, turgor normal. No rashes or lesions Neurologic: Alert and oriented X 3, normal strength and tone. Normal symmetric reflexes. Normal coordination and gait  EKG not performed today  ASSESSMENT AND PLAN:   PAC (premature atrial contraction) Tanya Cobb was referred to me by Dr. Damita Dunnings for PACs.  She had palpitations last year which resolved and recurred again on August 1.  They have occurred daily.  She feels them but otherwise is asymptomatic.  Her TSH was normal.  She drinks 1 cup of coffee a day.  I am going to check a 2-week event monitor and a 2D echo to further evaluate.      Lorretta Harp MD FACP,FACC,FAHA, [facility] 06/11/2018 11:47 AM

## 2018-06-11 NOTE — Patient Instructions (Signed)
Medication Instructions:   NO CHANGE  Testing/Procedures:  Your physician has requested that you have an echocardiogram. Echocardiography is a painless test that uses sound waves to create images of your heart. It provides your doctor with information about the size and shape of your heart and how well your heart's chambers and valves are working. This procedure takes approximately one hour. There are no restrictions for this procedure.   Your physician has recommended that you wear a 2 WEEK event monitor. Event monitors are medical devices that record the heart's electrical activity. Doctors most often [place] these monitors to diagnose arrhythmias. Arrhythmias are problems with the speed or rhythm of the heartbeat. The monitor is a small, portable device. You can wear one while you do your normal daily activities. This is usually used to diagnose what is causing palpitations/syncope (passing out).    Follow-Up:  Your physician recommends that you schedule a follow-up appointment in: [place]

## 2018-06-25 ENCOUNTER — Ambulatory Visit (INDEPENDENT_AMBULATORY_CARE_PROVIDER_SITE_OTHER): Payer: BC Managed Care – PPO

## 2018-06-25 ENCOUNTER — Ambulatory Visit (HOSPITAL_COMMUNITY): Payer: BC Managed Care – PPO | Attending: Cardiovascular Disease

## 2018-06-25 ENCOUNTER — Other Ambulatory Visit: Payer: Self-pay

## 2018-06-25 DIAGNOSIS — I071 Rheumatic tricuspid insufficiency: Secondary | ICD-10-CM | POA: Insufficient documentation

## 2018-06-25 DIAGNOSIS — R002 Palpitations: Secondary | ICD-10-CM

## 2018-06-25 DIAGNOSIS — Z853 Personal history of malignant neoplasm of breast: Secondary | ICD-10-CM | POA: Diagnosis not present

## 2018-06-25 DIAGNOSIS — I371 Nonrheumatic pulmonary valve insufficiency: Secondary | ICD-10-CM | POA: Insufficient documentation

## 2018-06-25 DIAGNOSIS — I491 Atrial premature depolarization: Secondary | ICD-10-CM | POA: Insufficient documentation

## 2018-07-07 ENCOUNTER — Encounter: Payer: Self-pay | Admitting: Gastroenterology

## 2018-07-21 ENCOUNTER — Encounter: Payer: Self-pay | Admitting: Gastroenterology

## 2018-07-21 ENCOUNTER — Ambulatory Visit (AMBULATORY_SURGERY_CENTER): Payer: BC Managed Care – PPO | Admitting: Gastroenterology

## 2018-07-21 VITALS — BP 104/67 | HR 65 | Temp 98.0°F | Resp 16 | Ht 67.0 in | Wt 146.0 lb

## 2018-07-21 DIAGNOSIS — K2950 Unspecified chronic gastritis without bleeding: Secondary | ICD-10-CM

## 2018-07-21 DIAGNOSIS — K317 Polyp of stomach and duodenum: Secondary | ICD-10-CM | POA: Diagnosis not present

## 2018-07-21 DIAGNOSIS — K219 Gastro-esophageal reflux disease without esophagitis: Secondary | ICD-10-CM

## 2018-07-21 MED ORDER — SODIUM CHLORIDE 0.9 % IV SOLN: 500.0000 mL | Freq: Once | INTRAVENOUS | Status: DC

## 2018-07-21 NOTE — Progress Notes (Signed)
Report to PACU, RN, vss, BBS= Clear.  

## 2018-07-21 NOTE — Progress Notes (Signed)
Pt's states no medical or surgical changes since previsit or office visit. 

## 2018-07-21 NOTE — Op Note (Signed)
[facility] Patient Name: Tanya Cobb Procedure Date: 07/21/2018 9:23 AM MRN: [id number] Endoscopist: Ladene Artist , MD Age: 52 Referring MD:  Date of Birth: 01/01/1966 Gender: Female Account #: [id number] Procedure:                Upper GI endoscopy Indications:              Gastroesophageal reflux disease Medicines:                Monitored Anesthesia Care Procedure:                Pre-Anesthesia Assessment:                           - Prior to the procedure, a History and Physical                            was performed, and patient medications and                            allergies were reviewed. The patient's tolerance of                            previous anesthesia was also reviewed. The risks                            and benefits of the procedure and the sedation                            options and risks were discussed with the patient.                            All questions were answered, and informed consent                            was obtained. Prior Anticoagulants: The patient has                            taken no previous anticoagulant or antiplatelet                            agents. ASA Grade Assessment: II - A patient with                            mild systemic disease. After reviewing the risks                            and benefits, the patient was deemed in                            satisfactory condition to undergo the procedure.                           After obtaining informed consent, the endoscope was  passed under direct vision. Throughout the                            procedure, the patient's blood pressure, pulse, and                            oxygen saturations were monitored continuously. The                            Endoscope was introduced through the mouth, and                            advanced to the second part of duodenum. The upper                            GI endoscopy was  accomplished without difficulty.                            The patient tolerated the procedure well. Scope In: Scope Out: Findings:                 The examined esophagus was normal.                           Four 3 to 7 mm sessile polyps with no bleeding and                            no stigmata of recent bleeding were found on the                            greater curvature of the stomach. The polyp was                            removed with a cold snare. Resection and retrieval                            were complete.                           Localized moderate inflammation characterized by                            friability and granularity was found on the greater                            curvature of the stomach. Biopsies were taken with                            a cold forceps for histology.                           The exam of the stomach was otherwise normal.  The duodenal bulb and second portion of the                            duodenum were normal. Complications:            No immediate complications. Estimated Blood Loss:     Estimated blood loss was minimal. Impression:               - Normal esophagus.                           - Four gastric polyps. Resected and retrieved.                           - Gastritis. Biopsied.                           - Normal duodenal bulb and second portion of the                            duodenum. Recommendation:           - Patient has a contact number available for                            emergencies. The signs and symptoms of potential                            delayed complications were discussed with the                            patient. Return to normal activities tomorrow.                            Written discharge instructions were provided to the                            patient.                           - Resume previous diet.                           - Continue present  medications.                           - Await pathology results. Ladene Artist, MD 07/21/2018 9:45:44 AM This report has been signed electronically.

## 2018-07-21 NOTE — Patient Instructions (Signed)
Handout given on gastritis.    YOU HAD AN ENDOSCOPIC PROCEDURE TODAY AT THE [facility]:   Refer to the procedure report that was given to you for any specific questions about what was found during the examination.  If the procedure report does not answer your questions, please call your gastroenterologist to clarify.  If you requested that your care partner not be given the details of your procedure findings, then the procedure report has been included in a sealed envelope for you to review at your convenience later.  YOU SHOULD EXPECT: Some feelings of bloating in the abdomen. Passage of more gas than usual.  Walking can help get rid of the air that was put into your GI tract during the procedure and reduce the bloating. If you had a lower endoscopy (such as a colonoscopy or flexible sigmoidoscopy) you may notice spotting of blood in your stool or on the toilet paper. If you underwent a bowel prep for your procedure, you may not have a normal bowel movement for a few days.  Please Note:  You might notice some irritation and congestion in your nose or some drainage.  This is from the oxygen used during your procedure.  There is no need for concern and it should clear up in a day or so.  SYMPTOMS TO REPORT IMMEDIATELY:   Following upper endoscopy (EGD)  Vomiting of blood or coffee ground material  New chest pain or pain under the shoulder blades  Painful or persistently difficult swallowing  New shortness of breath  Fever of 100F or higher  Black, tarry-looking stools  For urgent or emergent issues, a gastroenterologist can be reached at any hour by calling [phone].   DIET:  We do recommend a small meal at first, but then you may proceed to your regular diet.  Drink plenty of fluids but you should avoid alcoholic beverages for 24 hours.  ACTIVITY:  You should plan to take it easy for the rest of today and you should NOT DRIVE or use heavy machinery until tomorrow  (because of the sedation medicines used during the test).    FOLLOW UP: Our staff will call the number listed on your records the next business day following your procedure to check on you and address any questions or concerns that you may have regarding the information given to you following your procedure. If we do not reach you, we will leave a message.  However, if you are feeling well and you are not experiencing any problems, there is no need to return our call.  We will assume that you have returned to your regular daily activities without incident.  If any biopsies were taken you will be contacted by phone or by letter within the next 1-3 weeks.  Please call us at [phone] if you have not heard about the biopsies in 3 weeks.    SIGNATURES/CONFIDENTIALITY: You and/or your care partner have signed paperwork which will be entered into your electronic medical record.  These signatures attest to the fact that that the information above on your After Visit Summary has been reviewed and is understood.  Full responsibility of the confidentiality of this discharge information lies with you and/or your care-partner. 

## 2018-07-21 NOTE — Progress Notes (Signed)
Called to room to assist during endoscopic procedure.  Patient ID and intended procedure confirmed with present staff. Received instructions for my participation in the procedure from the performing physician.  

## 2018-07-22 ENCOUNTER — Telehealth: Payer: Self-pay

## 2018-07-22 ENCOUNTER — Telehealth: Payer: Self-pay | Admitting: *Deleted

## 2018-07-22 NOTE — Telephone Encounter (Signed)
  Follow up Call-  Call back number 07/21/2018  Post procedure Call Back phone  # [phone]  Permission to leave phone message Yes  Some recent data might be hidden     Patient questions:  Message left to call us if necessary.

## 2018-07-22 NOTE — Telephone Encounter (Signed)
Second post procedure follow up call, no answer 

## 2018-07-23 ENCOUNTER — Ambulatory Visit: Payer: BC Managed Care – PPO | Admitting: Cardiovascular Disease

## 2018-07-23 ENCOUNTER — Encounter: Payer: Self-pay | Admitting: Cardiovascular Disease

## 2018-07-23 DIAGNOSIS — I491 Atrial premature depolarization: Secondary | ICD-10-CM | POA: Diagnosis not present

## 2018-07-23 NOTE — Assessment & Plan Note (Signed)
Tanya Cobb was referred to me for symptom medic PACs and PVCs.  A 2D echo was normal.  An event monitor showed infrequent PVCs and PACs.  She actually was on Trental for unclear reasons but read the package insert and noted tachycardia and palpitations were a side effect and discontinued this on her own which resulted in marked improvement in her symptoms.  She is now minimally symptomatic.  I will see her back as needed.

## 2018-07-23 NOTE — Progress Notes (Signed)
07/23/2018 [name]   1966/07/23  [id number]  Primary Physician Tonia Ghent, MD Primary Cardiologist: Lorretta Harp MD Lupe Carney, [place]  HPI:  Tanya Cobb is a 52 y.o.  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  She was referred by Dr. Elsie Stain for cardiac evaluation because of symptomatic PACs.  I last saw her in the office 06/11/2018. She basically has no risk factors.  She is never had a heart attack or stroke although she does have family members that have had A. fib.  She does not smoke.  She drinks 6 to 7 glasses of either beer or wine a week and 1 cup of coffee in the morning.  She walks frequently and lifts weights.  She is a breast cancer survivor and was diagnosed in March and had surgery.  She finished radiation therapy in June.  She had palpitations last year which resolved spontaneously recurred again earlier this month.  Is been occurring daily.  TSH was checked which was normal.  She is not under unusual stress.  Since I saw her 6 weeks ago 2D echo was performed which was entirely normal and event monitor showed rare PACs and PVCs.  She apparently was on Trental for unclear reasons but 1 of the side effects was tachycardia and palpitations.  She discontinued this on her own which resulted in marked improvement in her symptoms.  Current Meds  Medication Sig  . calcium carbonate (OS-CAL) 600 MG TABS Take 600 mg by mouth daily.  Marland Kitchen esomeprazole (NEXIUM) 40 MG capsule TAKE ONE (1) CAPSULE BY MOUTH 2 TIMES DAILY BEFORE A MEAL  . Multiple Vitamin (MULTIVITAMIN) tablet Take 1 tablet by mouth daily.    . Omega-3 Fatty Acids (FISH OIL) 1000 MG CAPS Take 1 capsule by mouth daily.    . Probiotic Product (PROBIOTIC-10 PO) Take by mouth daily.  . tamoxifen (NOLVADEX) 20 MG tablet Take 1 tablet by mouth daily.  . vitamin E 400 UNIT capsule Take 1 capsule by mouth daily.  .  [DISCONTINUED] gabapentin (NEURONTIN) 100 MG capsule Take 1 capsule (100 mg total) by mouth at bedtime.  . [DISCONTINUED] pentoxifylline (TRENTAL) 400 MG CR tablet Take 1 tablet by mouth 3 (three) times daily.     No Known Allergies  Social History   Socioeconomic History  . Marital status: Married    Spouse name: Not on file  . Number of children: 2  . Years of education: Not on file  . Highest education level: Not on file  Occupational History  . Occupation: TEACHER    Employer: [email or web address]    Comment: Assistant Principle  Social Needs  . Financial resource strain: Not on file  . Food insecurity:    Worry: Not on file    Inability: Not on file  . Transportation needs:    Medical: Not on file    Non-medical: Not on file  Tobacco Use  . Smoking status: Never Smoker  . Smokeless tobacco: Never Used  Substance and Sexual Activity  . Alcohol use: Yes    Comment: 1-2 drinks per day  . Drug use: No  . Sexual activity: Not on file  Lifestyle  . Physical activity:    Days per week: Not on file    Minutes per session: Not on file  . Stress: Not on file  Relationships  . Social connections:  Talks on phone: Not on file    Gets together: Not on file    Attends religious service: Not on file    Active member of club or organization: Not on file    Attends meetings of clubs or organizations: Not on file    Relationship status: Not on file  . Intimate partner violence:    Fear of current or ex partner: Not on file    Emotionally abused: Not on file    Physically abused: Not on file    Forced sexual activity: Not on file  Other Topics Concern  . 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       Review of Systems: General: negative for chills, fever, night sweats or weight changes.  Cardiovascular: negative for chest pain, dyspnea on exertion, edema, orthopnea, palpitations, paroxysmal nocturnal dyspnea or  shortness of breath Dermatological: negative for rash Respiratory: negative for cough or wheezing Urologic: negative for hematuria Abdominal: negative for nausea, vomiting, diarrhea, bright red blood per rectum, melena, or hematemesis Neurologic: negative for visual changes, syncope, or dizziness All other systems reviewed and are otherwise negative except as noted above.    Blood pressure 116/74, pulse 71, height 5\' 7"  (1.702 m), weight 145 lb 9.6 oz (66 kg).  General appearance: alert and no distress Neck: no adenopathy, no carotid bruit, no JVD, supple, symmetrical, trachea midline and thyroid not enlarged, symmetric, no tenderness/mass/nodules Lungs: clear to auscultation bilaterally Heart: regular rate and rhythm, S1, S2 normal, no murmur, click, rub or gallop Extremities: extremities normal, atraumatic, no cyanosis or edema Pulses: 2+ and symmetric Skin: Skin color, texture, turgor normal. No rashes or lesions Neurologic: Alert and oriented X 3, normal strength and tone. Normal symmetric reflexes. Normal coordination and gait  EKG not performed today  ASSESSMENT AND PLAN:   PAC (premature atrial contraction) Tanya Cobb was referred to me for symptom medic PACs and PVCs.  A 2D echo was normal.  An event monitor showed infrequent PVCs and PACs.  She actually was on Trental for unclear reasons but read the package insert and noted tachycardia and palpitations were a side effect and discontinued this on her own which resulted in marked improvement in her symptoms.  She is now minimally symptomatic.  I will see her back as needed.      Lorretta Harp MD FACP,FACC,FAHA, [facility] 07/23/2018 9:39 AM

## 2018-07-23 NOTE — Patient Instructions (Signed)
Medication Instructions:  Your physician recommends that you continue on your current medications as directed. Please refer to the Current Medication list given to you today.   Labwork: none  Testing/Procedures: none  Follow-Up: Follow up with Dr. Berry as needed.   Any Other Special Instructions Will Be Listed Below (If Applicable).     If you need a refill on your cardiac medications before your next appointment, please call your pharmacy.   

## 2018-08-04 ENCOUNTER — Encounter: Payer: Self-pay | Admitting: Gastroenterology

## 2018-08-06 ENCOUNTER — Telehealth: Payer: Self-pay | Admitting: Gastroenterology

## 2018-08-06 NOTE — Telephone Encounter (Signed)
Pt called looking for path results.

## 2018-08-07 NOTE — Telephone Encounter (Signed)
Left message for patient to call back  

## 2018-08-08 NOTE — Telephone Encounter (Signed)
I reviewed the results of the pathology with the patient.  All questions answered.  She is asked to call back for any additional questions or concerns.

## 2018-11-24 ENCOUNTER — Ambulatory Visit: Payer: BC Managed Care – PPO | Admitting: Family Medicine

## 2018-11-24 ENCOUNTER — Encounter: Payer: Self-pay | Admitting: Family Medicine

## 2018-11-24 VITALS — BP 102/76 | HR 78 | Temp 98.4°F | Resp 10 | Ht 67.0 in | Wt 150.5 lb

## 2018-11-24 DIAGNOSIS — J0140 Acute pansinusitis, unspecified: Secondary | ICD-10-CM | POA: Diagnosis not present

## 2018-11-24 MED ORDER — AMOXICILLIN-POT CLAVULANATE 875-125 MG PO TABS: 1.0000 | ORAL_TABLET | Freq: Two times a day (BID) | ORAL | 0 refills | Status: AC

## 2018-11-24 NOTE — Patient Instructions (Addendum)
  1. Drink plenty of fluids 2. Get lots of rest  Sinus Congestion 1) Neti Pot (Saline rinse) -- 2 times day -- if tolerated 2) Flonase (Store Brand ok) - once daily 3) Over the counter congestion medications  Cough 1) Cough drops can be helpful 2) Nyquil (or nighttime cough medication) 3) Honey is proven to be one of the best cough medications   Sore Throat 1) Honey as above, cough drops 2) Ibuprofen or Aleve can be helpful 3) Salt water Gargles  If you develop fevers (Temperature >100.4), chills, worsening symptoms or symptoms lasting longer than 10 days return to clinic.    Ok to stop the antibiotic after 5 days if feeling back to normal.

## 2018-11-24 NOTE — Progress Notes (Signed)
Subjective:     Tanya Cobb is a 53 y.o. female presenting for URI (Started with a head cold right before Christmas and lasted for 2 days. Then 2 weeks later started again with head cold symptoms. has used Ibuprofen and Mucinex. last week started to feel bad off and on. Cough, drainage, runny nose, very fatigue, sores in the mouth. No fever that she knows of. Headaches present. No nausea or vomiting.)     URI   This is a new problem. The current episode started 1 to 4 weeks ago. The problem has been gradually worsening. There has been no fever. Associated symptoms include coughing, ear pain, headaches, a plugged ear sensation, rhinorrhea, sneezing and a sore throat. Pertinent negatives include no chest pain, congestion, diarrhea, nausea, sinus pain, vomiting or wheezing. She has tried decongestant and NSAIDs for the symptoms. The treatment provided no relief.     Review of Systems  HENT: Positive for ear pain, mouth sores, rhinorrhea, sneezing and sore throat. Negative for congestion and sinus pain.   Eyes: Positive for pain.  Respiratory: Positive for cough and chest tightness. Negative for shortness of breath and wheezing.   Cardiovascular: Negative for chest pain.  Gastrointestinal: Positive for constipation. Negative for diarrhea, nausea and vomiting.  Neurological: Positive for headaches.     Social History   Tobacco Use  Smoking Status Never Smoker  Smokeless Tobacco Never Used        Objective:    BP Readings from Last 3 Encounters:  11/24/18 102/76  07/23/18 116/74  07/21/18 104/67   Wt Readings from Last 3 Encounters:  11/24/18 150 lb 8 oz (68.3 kg)  07/23/18 145 lb 9.6 oz (66 kg)  07/21/18 146 lb (66.2 kg)    BP 102/76   Pulse 78   Temp 98.4 F (36.9 C)   Resp 10   Ht 5\' 7"  (1.702 m)   Wt 150 lb 8 oz (68.3 kg)   SpO2 96%   BMI 23.57 kg/m    Physical Exam Constitutional:      General: She is not in acute distress.    Appearance: She is  well-developed. She is not diaphoretic.  HENT:     Head: Normocephalic and atraumatic.     Right Ear: Tympanic membrane and ear canal normal.     Left Ear: Tympanic membrane and ear canal normal.     Nose: Mucosal edema and rhinorrhea present.     Right Sinus: No maxillary sinus tenderness or frontal sinus tenderness.     Left Sinus: No maxillary sinus tenderness or frontal sinus tenderness.     Mouth/Throat:     Pharynx: Uvula midline. Posterior oropharyngeal erythema present. No oropharyngeal exudate.     Tonsils: Swelling: 0 on the right. 0 on the left.  Eyes:     General: No scleral icterus.    Conjunctiva/sclera: Conjunctivae normal.  Neck:     Musculoskeletal: Neck supple.  Cardiovascular:     Rate and Rhythm: Normal rate and regular rhythm.     Heart sounds: Normal heart sounds. No murmur.  Pulmonary:     Effort: Pulmonary effort is normal. No respiratory distress.     Breath sounds: Normal breath sounds.  Lymphadenopathy:     Cervical: No cervical adenopathy.  Skin:    General: Skin is warm and dry.     Capillary Refill: Capillary refill takes less than 2 seconds.  Neurological:     Mental Status: She is alert.  Assessment & Plan:   Problem List Items Addressed This Visit    None    Visit Diagnoses    Acute non-recurrent pansinusitis    -  Primary   Relevant Medications   amoxicillin-clavulanate (AUGMENTIN) 875-125 MG tablet     Saline rinse and Flonase  Return if symptoms worsen or fail to improve.  Lesleigh Noe, MD

## 2018-12-09 NOTE — Progress Notes (Signed)
[facility]  [phone]) [phone] Fax:(336) [phone]    ID: Tanya Cobb DOB: [birth year]  MR#: [id number]  YPP#:[id number]  Patient Care Team: Tonia Ghent, MD as PCP - General (Family Medicine) Chivas Notz, Virgie Dad, MD as Consulting Physician (Oncology) Ladene Artist, MD as Consulting Physician (Gastroenterology) Lorretta Harp, MD as Consulting Physician (Cardiology) Andi Devon, MD as Referring Physician (General Surgery) Shireen Quan, PhD as Referring Physician (Radiation Oncology) Dorna Leitz, MD as Consulting Physician (Orthopedic Surgery) Arvella Nigh, MD as Consulting Physician (Obstetrics and Gynecology) OTHER MD:   CHIEF COMPLAINT: Ductal carcinoma in situ  CURRENT TREATMENT: [tamoxifen]   HISTORY OF CURRENT ILLNESS: Tanya Cobb had routine screening mammography on 11/07/2017 showing a possible abnormality in the right breast. She underwent unilateral right diagnostic mammography with tomography at [facility] on 12/03/2017 showing: Breast Density Category C. Spot compression magnification views were performed over the upper-outer right breast demonstrating a 2.5 cm group of linear oriented predominately punctate and round microcalcifications.   Accordingly on 12/09/2017 she proceeded to biopsy of the right breast area in question. The pathology from this procedure showed ([id number]): ductal carcinoma in situ with calcifications, high grade. Prognostic indicators significant for: estrogen receptor, 90% positive with strong staining intensity and progesterone receptor, 0% negative.   She underwent a partial right mastectomy on 01/07/2018 with Dr. Freddi Che. The pathology from this procedure showed ([id number]): ductal carcinoma in situ, grade III. Prognostic indicators significant for: estrogen receptor, 90% positive and progesterone receptor, 0% negative.  Additional biopsies were performed on the same day. The pathology from this  procedure showed ([id number]):  B: Breast, right, superior margin, excision   - Negative for in situ or invasive carcinoma  C: Breast, right, medial margin, excision   - Negative for in situ or invasive carcinoma  D: Breast, right, lateral margin, excision   - Negative for in situ or invasive carcinoma  E: Breast, right, inferior margin, excision   - Negative for in situ or invasive carcinoma  F: Breast, right, posterior medial margin, excision   - Negative for in situ or invasive carcinoma  G: Breast, right, posterior lateral margin, excision   - Biopsy clip and reaction present   - Extensive cautery artifact; negative for definitive in situ or invasive carcinoma   She completed 20 sessions of radiation therapy following her lumpectomy.   The patient's subsequent history is as detailed below.   INTERVAL HISTORY: Tanya Cobb was evaluated in the breast cancer clinic on 12/10/2018.   Tanya Cobb started Tamoxifen on 04/07/2018. Since starting treatment, she has noted problems with irritability, depression, fatigue, insomnia, joint pain, gum issues, and skin issues.  She is having problems getting through her day at work and is even considering retiring. These symptoms are what led to her recommendation to come here via Dr. Arvella Nigh.   REVIEW OF SYSTEMS: The patient denies unusual headaches, visual changes, nausea, vomiting, stiff neck, dizziness, or gait imbalance. There has been no cough, phlegm production, or pleurisy, no chest pain or pressure, and no change in bowel or bladder habits. The patient denies fever, rash, bleeding, or unexplained weight loss. A detailed review of systems was otherwise entirely negative.   PAST MEDICAL HISTORY: Past Medical History:  Diagnosis Date  . Breast cancer ([facility]) 12/2017  . Cervical strain   . GERD (gastroesophageal reflux disease)    w/ LPR     PAST SURGICAL HISTORY: Past Surgical History:  Procedure Laterality Date  . BREAST LUMPECTOMY  12/2017  . NECK SURGERY  2008  . SHOULDER ARTHROSCOPY  2016     FAMILY HISTORY: Family History  Problem Relation Age of Onset  . Diabetes Other        [employer]  . Ovarian cancer Maternal Grandmother   . Heart disease Maternal Grandmother        and MGF  . Colon cancer Other        Great Grandmother  . Alzheimer's disease Father    Tanya Cobb's father is 29 years old and her mother is 55 years old as of 12/10/2018. The patient has 1 brother and 1 sister. Tanya Cobb maternal grandmother was diagnosed with ovarian cancer. Patient denies anyone in her family having breast, prostate, or pancreatic cancer.    GYNECOLOGIC HISTORY:  No LMP recorded. Patient is postmenopausal. 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 Her husband, Shanon Brow, runs his own window Printmaker. Together they have two children, Tanya Cobb and Tanya Cobb. 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 Tanya Cobb is currently attending [employer] in [employer]. Tanya Cobb attends Section.   HEALTH MAINTENANCE: Social History   Tobacco Use  . Smoking status: Never Smoker  . Smokeless tobacco: Never Used  Substance Use Topics  . Alcohol use: Yes    Comment: 1-2 drinks per day  . Drug use: No    Colonoscopy: yes, Dr. Fuller Plan  PAP: yes, Dr. Arvella Nigh  Bone density: yes   No Known Allergies  Current Outpatient Medications  Medication Sig Dispense Refill  . calcium carbonate (OS-CAL) 600 MG TABS Take 600 mg by mouth daily.    Marland Kitchen esomeprazole (NEXIUM) 40 MG capsule TAKE ONE (1) CAPSULE BY MOUTH 2 TIMES DAILY BEFORE A MEAL 60 capsule 11  . Glucosamine 750 MG TABS Take by mouth daily.    . Multiple Vitamin (MULTIVITAMIN) tablet Take 1 tablet by  mouth daily.      . Omega-3 Fatty Acids (FISH OIL) 1000 MG CAPS Take 1 capsule by mouth daily.      . Probiotic Product (PROBIOTIC-10 PO) Take by mouth daily.    . tamoxifen (NOLVADEX) 20 MG tablet Take 1 tablet by mouth daily.    Marland Kitchen venlafaxine XR (EFFEXOR-XR) 37.5 MG 24 hr capsule Take 1 capsule (37.5 mg total) by mouth daily with breakfast. 60 capsule 6   No current facility-administered medications for this visit.      OBJECTIVE: Middle-aged white woman in no acute distress  Vitals:   12/10/18 1538  BP: 120/66  Pulse: 79  Resp: 18  Temp: 98.3 F (36.8 C)  SpO2: 99%     Body mass index is 23.23 kg/m.   Wt Readings from Last 3 Encounters:  12/10/18 148 lb 4.8 oz (67.3 kg)  11/24/18 150 lb 8 oz (68.3 kg)  07/23/18 145 lb 9.6 oz (66 kg)      ECOG FS:1 - Symptomatic but completely ambulatory  Ocular: Sclerae unicteric, pupils round and equal Lymphatic: No cervical or supraclavicular adenopathy Lungs no rales or rhonchi Heart regular rate and rhythm Abd soft, nontender, positive bowel sounds MSK no focal spinal tenderness, no joint edema Neuro: non-focal, well-oriented, appropriate affect Breasts:    LAB RESULTS:  CMP     Component  Value Date/Time   NA 141 06/03/2018 1252   K 4.1 06/03/2018 1252   CL 104 06/03/2018 1252   CO2 31 06/03/2018 1252   GLUCOSE 95 06/03/2018 1252   BUN 12 06/03/2018 1252   CREATININE 0.85 06/03/2018 1252   CALCIUM 9.4 06/03/2018 1252   PROT 6.9 06/03/2018 1252   ALBUMIN 4.2 06/03/2018 1252   AST 19 06/03/2018 1252   ALT 10 06/03/2018 1252   ALKPHOS 44 06/03/2018 1252   BILITOT 0.5 06/03/2018 1252    No results found for: TOTALPROTELP, ALBUMINELP, A1GS, A2GS, BETS, BETA2SER, GAMS, MSPIKE, SPEI  No results found for: KPAFRELGTCHN, LAMBDASER, KAPLAMBRATIO  Lab Results  Component Value Date   WBC 5.8 06/03/2018   NEUTROABS 3.7 06/03/2018   HGB 13.3 06/03/2018   HCT 38.8 06/03/2018   MCV 90.0 06/03/2018   PLT 186.0 06/03/2018      _0 @  No results found for: LABCA2  No components found for: ERXVQM086  No results for input(s): INR in the last 168 hours.  No results found for: LABCA2  No results found for: PYP950  No results found for: DTO671  No results found for: IWP809  No results found for: CA2729  No components found for: HGQUANT  No results found for: CEA1 / No results found for: CEA1   No results found for: AFPTUMOR  No results found for: CHROMOGRNA  No results found for: PSA1  No visits with results within 3 Day(s) from this visit.  Latest known visit with results is:  Office Visit on 06/03/2018  Component Date Value Ref Range Status  . Sodium 06/03/2018 141  135 - 145 mEq/L Final  . Potassium 06/03/2018 4.1  3.5 - 5.1 mEq/L Final  . Chloride 06/03/2018 104  96 - 112 mEq/L Final  . CO2 06/03/2018 31  19 - 32 mEq/L Final  . Glucose, Bld 06/03/2018 95  70 - 99 mg/dL Final  . BUN 06/03/2018 12  6 - 23 mg/dL Final  . Creatinine, Ser 06/03/2018 0.85  0.40 - 1.20 mg/dL Final  . Total Bilirubin 06/03/2018 0.5  0.2 - 1.2 mg/dL Final  . Alkaline Phosphatase 06/03/2018 44  39 - 117 U/L Final  . AST 06/03/2018 19  0 - 37 U/L Final  . ALT 06/03/2018 10  0 - 35 U/L Final  . Total Protein 06/03/2018 6.9  6.0 - 8.3 g/dL Final  . Albumin 06/03/2018 4.2  3.5 - 5.2 g/dL Final  . Calcium 06/03/2018 9.4  8.4 - 10.5 mg/dL Final  . GFR 06/03/2018 74.06  >60.00 mL/min Final  . WBC 06/03/2018 5.8  4.0 - 10.5 K/uL Final  . RBC 06/03/2018 4.31  3.87 - 5.11 Mil/uL Final  . Hemoglobin 06/03/2018 13.3  12.0 - 15.0 g/dL Final  . HCT 06/03/2018 38.8  36.0 - 46.0 % Final  . MCV 06/03/2018 90.0  78.0 - 100.0 fl Final  . MCHC 06/03/2018 34.3  30.0 - 36.0 g/dL Final  . RDW 06/03/2018 13.4  11.5 - 15.5 % Final  . Platelets 06/03/2018 186.0  150.0 - 400.0 K/uL Final  . Neutrophils Relative % 06/03/2018 64.6  43.0 - 77.0 % Final  . Lymphocytes Relative 06/03/2018 24.2  12.0 - 46.0 % Final  . Monocytes  Relative 06/03/2018 7.8  3.0 - 12.0 % Final  . Eosinophils Relative 06/03/2018 2.2  0.0 - 5.0 % Final  . Basophils Relative 06/03/2018 1.2  0.0 - 3.0 % Final  . Neutro Abs 06/03/2018 3.7  1.4 -  7.7 K/uL Final  . Lymphs Abs 06/03/2018 1.4  0.7 - 4.0 K/uL Final  . Monocytes Absolute 06/03/2018 0.5  0.1 - 1.0 K/uL Final  . Eosinophils Absolute 06/03/2018 0.1  0.0 - 0.7 K/uL Final  . Basophils Absolute 06/03/2018 0.1  0.0 - 0.1 K/uL Final  . TSH 06/03/2018 2.04  0.35 - 4.50 uIU/mL Final    (this displays the last labs from the last 3 days)  No results found for: TOTALPROTELP, ALBUMINELP, A1GS, A2GS, BETS, BETA2SER, GAMS, MSPIKE, SPEI (this displays SPEP labs)  No results found for: KPAFRELGTCHN, LAMBDASER, KAPLAMBRATIO (kappa/lambda light chains)  No results found for: HGBA, HGBA2QUANT, HGBFQUANT, HGBSQUAN (Hemoglobinopathy evaluation)   No results found for: LDH  No results found for: IRON, TIBC, IRONPCTSAT (Iron and TIBC)  No results found for: FERRITIN  Urinalysis No results found for: COLORURINE, APPEARANCEUR, LABSPEC, PHURINE, GLUCOSEU, HGBUR, BILIRUBINUR, KETONESUR, PROTEINUR, UROBILINOGEN, NITRITE, LEUKOCYTESUR   STUDIES:  No results found.   ELIGIBLE FOR AVAILABLE RESEARCH PROTOCOL: no   ASSESSMENT: 53 y.o. [place], [place] woman status post right lumpectomy 01/08/2019 for a 1.6 cm ductal carcinoma in situ, grade 3, estrogen receptor positive, progesterone receptor negative, with clear margins  (1) status post adjuvant radiation to 5272cGy completed on 03/24/2018  (2) tamoxifen started 04/07/2018, discontinued 12/10/2018  (3) genetics testing ([facility], 2019) negative    PLAN: I spent approximately 60 minutes face to face with Tanya Cobb with more than 50% of that time spent in counseling and coordination of care. Specifically we reviewed the biology of the patient's diagnosis and the specifics of her situation.  Tanya Cobb understands that in noninvasive ductal  carcinoma, also called ductal carcinoma in situ ("DCIS") the breast cancer cells remain trapped in the ducts were they started. They cannot travel to a vital organ. For that reason these cancers in themselves are not life-threatening.  Since she followed standard of care and agreed to keep her breast, there will be some risk of local recurrence. The recurrence can only be in the same breast since, again, the cells are trapped in the ducts. There is no connection from one breast to the other. The risk of local recurrence is cut by almost 2/3 with radiation, which is standard in this situation.  In estrogen receptor positive cancers like Tanya Cobb, anti-estrogens will further reduce the risk of recurrence, by one half.  Since her risk of local recurrence after radiation is very low, in the 4-6% range, the actual benefit of antiestrogens in this setting is very low.  76 women would have to take antiestrogens for 3 to benefit (94 of them were cured with surgery and radiation, and 3 had the cancer recur despite antiestrogens).  Given this very small margin of benefit I am comfortable with her not taking antiestrogens as far as her DCIS is concerned.  However anti-estrogens will also lower the risk of a new breast cancer developing in either breast, also by one half. That risk approaches 1% per year in cases like Tanya Cobb.  In terms of prevention it would be reasonable for her to take an antiestrogen, the problem being that she has developed multiple symptoms which may or may not be related to her tamoxifen.  We reviewed general symptoms of menopause and Tanya Cobb had remarkably few of these prior to starting tamoxifen.  Some of her problems are clearly related: For example her insomnia is at least partly due to hot flashes occurring at night.  Dr. Radene Knee had prescribed gabapentin and this seemed to be working  but she has stopped taking it.   The bigger problem however is posttraumatic stress.  This is  very common in my patients and it is certainly a major component of what they call "chemo brain" or mental fog".  It does not matter that her tumor was not invasive and not life-threatening: Her body does not agree with what her mind is telling her.  The result of course is a constant state of alertness/irritability which leaves little room for multitasking or being able to concentrate on moderately complex issues, or even enjoy a movie or a meal with family.  There is also an element of grief since the patient is no longer the person she was before and this can be experienced as a significant loss, almost like a death.    We discussed the similarity between the experience of patients with cancer and those with soldiers coming off active duty, and the role of antidepressants in both cases. We specifically discussed venlafaxine and she has a good understanding of its possible side effects as well as its possible benefits in this setting. She will start at 37.5 mg daily and call us in about 3 weeks-- if she is tolerating it well and requires a higher dose we will go to 75 mg daily.  She is going to stop her tamoxifen and over the next 3 to 4 months keep a tab on which of the symptoms she was experiencing before have cleared or not cleared.  I also urged her to go back to gabapentin and take it nightly; if possible do vigorous exercise 45 minutes 5x/week; and consider participation in our [facility] group, which I think will be very reassuring for her.  Tentatively she will return to see me in about 6 months, but if all is going well by then she will call and cancel andcontinue her follow-up with Dr Harle Battiest, Virgie Dad, MD  12/10/18 5:16 PM Medical Oncology and Hematology [facility] [place], [place] [address] Tel. [phone]    Fax. [phone]    I, Jacqualyn Posey am acting as a Education administrator for Chauncey Cruel, MD.   I, Lurline Del MD, have  reviewed the above documentation for accuracy and completeness, and I agree with the above.

## 2018-12-10 ENCOUNTER — Encounter: Payer: Self-pay | Admitting: Oncology

## 2018-12-10 ENCOUNTER — Inpatient Hospital Stay: Payer: BC Managed Care – PPO | Attending: Oncology | Admitting: Oncology

## 2018-12-10 DIAGNOSIS — D0511 Intraductal carcinoma in situ of right breast: Secondary | ICD-10-CM | POA: Insufficient documentation

## 2018-12-10 DIAGNOSIS — Z79810 Long term (current) use of selective estrogen receptor modulators (SERMs): Secondary | ICD-10-CM | POA: Insufficient documentation

## 2018-12-10 DIAGNOSIS — Z170 Estrogen receptor positive status [ER+]: Secondary | ICD-10-CM | POA: Diagnosis not present

## 2018-12-10 DIAGNOSIS — Z923 Personal history of irradiation: Secondary | ICD-10-CM | POA: Diagnosis not present

## 2018-12-10 DIAGNOSIS — Z79899 Other long term (current) drug therapy: Secondary | ICD-10-CM | POA: Insufficient documentation

## 2018-12-10 MED ORDER — VENLAFAXINE HCL ER 37.5 MG PO CP24: 37.5000 mg | ORAL_CAPSULE | Freq: Every day | ORAL | 6 refills | Status: DC

## 2018-12-11 ENCOUNTER — Telehealth: Payer: Self-pay | Admitting: Oncology

## 2018-12-11 NOTE — Telephone Encounter (Signed)
No los °

## 2018-12-11 NOTE — Telephone Encounter (Signed)
Scheduled appt per 2/19 sch message - pt is aware of appt date and time  

## 2018-12-18 ENCOUNTER — Other Ambulatory Visit: Payer: Self-pay | Admitting: Oncology

## 2018-12-18 DIAGNOSIS — Z9889 Other specified postprocedural states: Secondary | ICD-10-CM

## 2018-12-26 ENCOUNTER — Ambulatory Visit
Admission: RE | Admit: 2018-12-26 | Discharge: 2018-12-26 | Disposition: A | Discharge status: Home / Self Care | Payer: BC Managed Care – PPO | Source: Ambulatory Visit | Attending: Oncology | Admitting: Oncology

## 2018-12-26 ENCOUNTER — Other Ambulatory Visit: Payer: Self-pay | Admitting: Oncology

## 2018-12-26 DIAGNOSIS — Z9889 Other specified postprocedural states: Secondary | ICD-10-CM

## 2019-06-04 ENCOUNTER — Telehealth: Payer: Self-pay | Admitting: Family Medicine

## 2019-06-04 ENCOUNTER — Ambulatory Visit (INDEPENDENT_AMBULATORY_CARE_PROVIDER_SITE_OTHER): Payer: BC Managed Care – PPO | Admitting: Family Medicine

## 2019-06-04 ENCOUNTER — Other Ambulatory Visit: Payer: BC Managed Care – PPO

## 2019-06-04 ENCOUNTER — Encounter: Payer: Self-pay | Admitting: Family Medicine

## 2019-06-04 VITALS — Temp 97.7°F | Ht 67.0 in | Wt 147.0 lb

## 2019-06-04 DIAGNOSIS — G44209 Tension-type headache, unspecified, not intractable: Secondary | ICD-10-CM | POA: Diagnosis not present

## 2019-06-04 DIAGNOSIS — R82998 Other abnormal findings in urine: Secondary | ICD-10-CM

## 2019-06-04 LAB — POC URINALSYSI DIPSTICK (AUTOMATED)
Bilirubin, UA: NEGATIVE
Blood, UA: NEGATIVE
Glucose, UA: NEGATIVE
Ketones, UA: NEGATIVE
Leukocytes, UA: NEGATIVE
Nitrite, UA: NEGATIVE
Protein, UA: NEGATIVE
Spec Grav, UA: 1.01 (ref 1.010–1.025)
Urobilinogen, UA: 0.2 E.U./dL
pH, UA: 5.5 (ref 5.0–8.0)

## 2019-06-04 MED ORDER — CEPHALEXIN 500 MG PO CAPS: 500.0000 mg | ORAL_CAPSULE | Freq: Two times a day (BID) | ORAL | 0 refills | Status: DC

## 2019-06-04 NOTE — Telephone Encounter (Signed)
Per Dr. Damita Dunnings patient needs an appointment virtual or in office. Dr Damita Dunnings does not have an opening today it would have to be with someone else. Thank you

## 2019-06-04 NOTE — Progress Notes (Signed)
Tanya Cobb T. Desmin Daleo, MD Primary Care and [facility] at [facility] [name], [address] Phone: [phone]  FAX: [place] - 53 y.o. female  MRN [id number]  Date of Birth: 1966-09-21  Visit Date: 06/04/2019  PCP: Tonia Ghent, MD  Referred by: Tonia Ghent, MD Chief Complaint  Patient presents with  . Back Pain    Lower Left-at edge of rib cage  . Dark Urine  . Headache   Virtual Visit via Video Note:  I connected with  Tanya Cobb on 06/04/2019  2:40 PM EDT by a video enabled telemedicine application and verified that I am speaking with the correct person using two identifiers.   Location patient: home computer, tablet, or smartphone Location provider: work or home office Consent: Verbal consent directly obtained from Tanya Cobb. Persons participating in the virtual visit: patient, provider  I discussed the limitations of evaluation and management by telemedicine and the availability of in person appointments. The patient expressed understanding and agreed to proceed.  History of Present Illness:  UTI? unclear  5 d, left back bottom of ribcage, ha.  A week ago, umfortable.  No urgency.   Patient has multiple symptoms of been ongoing for about 5 days.  She does have some low back pain at the edge of her lowest rib.  He also has some dark urine that is somewhat malodorous.  She also has a headache.  She denies abdominal pain.  She denies hypogastric pain.  She is not had any blood in her urine or stool.  She has not had any nausea or vomiting.  No diarrhea.  No respiratory symptoms at all.  AZO?  Review of Systems as above: See pertinent positives and pertinent negatives per HPI No acute distress verbally  Past Medical History, Surgical History, Social History, Family History, Problem List, Medications, and Allergies have been reviewed and updated if relevant.    Observations/Objective/Exam:  An attempt was made to discern vital signs over the phone and per patient if applicable and possible.   General:    Alert, Oriented, appears well and in no acute distress HEENT:     Atraumatic, conjunctiva clear, no obvious abnormalities on inspection of external nose and ears.  Neck:    Normal movements of the head and neck Pulmonary:     On inspection no signs of respiratory distress, breathing rate appears normal, no obvious gross SOB, gasping or wheezing Cardiovascular:    No obvious cyanosis Musculoskeletal:    Moves all visible extremities without noticeable abnormality Psych / Neurological:     Pleasant and cooperative, no obvious depression or anxiety, speech and thought processing grossly intact  Results for orders placed or performed in visit on 06/04/19  POCT Urinalysis Dipstick (Automated)  Result Value Ref Range   Color, UA Yellow    Clarity, UA Clear    Glucose, UA Negative Negative   Bilirubin, UA Negative    Ketones, UA Negative    Spec Grav, UA 1.010 1.010 - 1.025   Blood, UA Negative    pH, UA 5.5 5.0 - 8.0   Protein, UA Negative Negative   Urobilinogen, UA 0.2 0.2 or 1.0 E.U./dL   Nitrite, UA Negative    Leukocytes, UA Negative Negative     Assessment and Plan:    ICD-10-CM   1. Dark urine  R82.998 POCT Urinalysis Dipstick (Automated)    Urine Culture  2. Acute  non intractable tension-type headache  G44.209    The etiology is unclear.  I am going to do a urine culture.  Multiple symptoms.  Trial of antibiotics for potential urinary tract infection.  Follow-up with primary care doctor on Monday if symptoms do not resolve.  Face-to-face encounter needed.  I discussed the assessment and treatment plan with the patient. The patient was provided an opportunity to ask questions and all were answered. The patient agreed with the plan and demonstrated an understanding of the instructions.   The patient was advised to call back  or seek an in-person evaluation if the symptoms worsen or if the condition fails to improve as anticipated.  Follow-up: prn unless noted otherwise below No follow-ups on file.  Meds ordered this encounter  Medications  . cephALEXin (KEFLEX) 500 MG capsule    Sig: Take 1 capsule (500 mg total) by mouth 2 (two) times daily.    Dispense:  14 capsule    Refill:  0   Orders Placed This Encounter  Procedures  . Urine Culture  . POCT Urinalysis Dipstick (Automated)    Signed,  Madalee Altmann T. Tamari Redwine, MD

## 2019-06-04 NOTE — Telephone Encounter (Signed)
Pt scheduled with Dr Lorelei Pont at 2:40pm today via DOXY Pt to drop off urine prior to appt  Will send to [facility] as FYI.   Nothing further needed.

## 2019-06-04 NOTE — Telephone Encounter (Signed)
Noted. Thanks.

## 2019-06-04 NOTE — Telephone Encounter (Signed)
Pt c/o back pain - mid to upper back pain on the left side. Pt also having slight headache and has noticed darker than usual urine. Denies Fever, urgency, frequency.  Pt notes that about 2 weeks ago she had some urinary discomfort which has resolved.   Pt advised to bring a urine sample by (sterilized container - drop off on picnic table and call or bring in office) so that we can check this and aware that I will check with Dr Damita Dunnings to see if he wants to see her this afternoon via DOXY.   Please advise if you want to go ahead and schedule a DOXY appt or wait until we receive urine to do U/A. Thanks.

## 2019-06-05 ENCOUNTER — Encounter: Payer: Self-pay | Admitting: Family Medicine

## 2019-06-06 LAB — URINE CULTURE
MICRO NUMBER:: 768589
SPECIMEN QUALITY:: ADEQUATE

## 2019-06-09 ENCOUNTER — Inpatient Hospital Stay: Payer: BC Managed Care – PPO | Admitting: Oncology

## 2019-08-12 ENCOUNTER — Other Ambulatory Visit: Payer: Self-pay

## 2019-08-12 MED ORDER — ESOMEPRAZOLE MAGNESIUM 40 MG PO CPDR: DELAYED_RELEASE_CAPSULE | ORAL | 0 refills | Status: DC

## 2019-09-02 ENCOUNTER — Other Ambulatory Visit: Payer: Self-pay | Admitting: Obstetrics and Gynecology

## 2019-09-02 DIAGNOSIS — Z9889 Other specified postprocedural states: Secondary | ICD-10-CM

## 2019-09-28 ENCOUNTER — Ambulatory Visit: Payer: BC Managed Care – PPO | Admitting: Gastroenterology

## 2019-09-28 ENCOUNTER — Other Ambulatory Visit (INDEPENDENT_AMBULATORY_CARE_PROVIDER_SITE_OTHER): Payer: BC Managed Care – PPO

## 2019-09-28 ENCOUNTER — Other Ambulatory Visit: Payer: Self-pay

## 2019-09-28 ENCOUNTER — Encounter: Payer: Self-pay | Admitting: Gastroenterology

## 2019-09-28 VITALS — BP 114/76 | HR 80 | Temp 98.3°F | Ht 66.25 in | Wt 155.0 lb

## 2019-09-28 DIAGNOSIS — R1011 Right upper quadrant pain: Secondary | ICD-10-CM

## 2019-09-28 DIAGNOSIS — R140 Abdominal distension (gaseous): Secondary | ICD-10-CM | POA: Diagnosis not present

## 2019-09-28 DIAGNOSIS — K219 Gastro-esophageal reflux disease without esophagitis: Secondary | ICD-10-CM | POA: Diagnosis not present

## 2019-09-28 LAB — CBC WITH DIFFERENTIAL/PLATELET
Basophils Absolute: 0.1 10*3/uL (ref 0.0–0.1)
Basophils Relative: 1.3 % (ref 0.0–3.0)
Eosinophils Absolute: 0.2 10*3/uL (ref 0.0–0.7)
Eosinophils Relative: 3.5 % (ref 0.0–5.0)
HCT: 41.3 % (ref 36.0–46.0)
Hemoglobin: 13.9 g/dL (ref 12.0–15.0)
Lymphocytes Relative: 31.4 % (ref 12.0–46.0)
Lymphs Abs: 1.5 10*3/uL (ref 0.7–4.0)
MCHC: 33.6 g/dL (ref 30.0–36.0)
MCV: 91.4 fl (ref 78.0–100.0)
Monocytes Absolute: 0.4 10*3/uL (ref 0.1–1.0)
Monocytes Relative: 8.6 % (ref 3.0–12.0)
Neutro Abs: 2.6 10*3/uL (ref 1.4–7.7)
Neutrophils Relative %: 55.2 % (ref 43.0–77.0)
Platelets: 179 10*3/uL (ref 150.0–400.0)
RBC: 4.52 Mil/uL (ref 3.87–5.11)
RDW: 12.9 % (ref 11.5–15.5)
WBC: 4.7 10*3/uL (ref 4.0–10.5)

## 2019-09-28 LAB — COMPREHENSIVE METABOLIC PANEL
ALT: 10 U/L (ref 0–35)
AST: 20 U/L (ref 0–37)
Albumin: 4.3 g/dL (ref 3.5–5.2)
Alkaline Phosphatase: 64 U/L (ref 39–117)
BUN: 12 mg/dL (ref 6–23)
CO2: 29 mEq/L (ref 19–32)
Calcium: 9.4 mg/dL (ref 8.4–10.5)
Chloride: 106 mEq/L (ref 96–112)
Creatinine, Ser: 0.78 mg/dL (ref 0.40–1.20)
GFR: 76.57 mL/min (ref >60.00)
Glucose, Bld: 72 mg/dL (ref 70–99)
Potassium: 4.2 mEq/L (ref 3.5–5.1)
Sodium: 142 mEq/L (ref 135–145)
Total Bilirubin: 0.5 mg/dL (ref 0.2–1.2)
Total Protein: 6.8 g/dL (ref 6.0–8.3)

## 2019-09-28 LAB — LIPASE: Lipase: 54 U/L (ref 11.0–59.0)

## 2019-09-28 MED ORDER — ESOMEPRAZOLE MAGNESIUM 40 MG PO CPDR: DELAYED_RELEASE_CAPSULE | ORAL | 11 refills | Status: DC

## 2019-09-28 MED ORDER — DICYCLOMINE HCL 10 MG PO CAPS: 10.0000 mg | ORAL_CAPSULE | Freq: Three times a day (TID) | ORAL | 11 refills | Status: DC

## 2019-09-28 NOTE — Progress Notes (Signed)
    History of Present Illness: This is a 53 year old female with history of GERD and LPR.  Her symptoms have been well controlled on Nexium 40 mg twice daily. She notes persistent problems with frequent gas, frequent generalized abdominal bloating for many months.  No clear foods that worsen symptoms.  For the past 2 days she has had right upper quadrant, upper right flank pain that does not change with meals, bowel movements or body movement.  EGD 06/2018 - Normal esophagus. - Four gastric polyps. Resected and retrieved. - Gastritis. Biopsied. - Normal duodenal bulb and second portion of the duodenum. Path: mild chronic gastritis and benign fundic gland polyps   Current Medications, Allergies, Past Medical History, Past Surgical History, Family History and Social History were reviewed in [employer] record.   Physical Exam: General: Well developed, well nourished, no acute distress Head: Normocephalic and atraumatic Eyes:  sclerae anicteric, EOMI Ears: Normal auditory acuity Mouth: No deformity or lesions Lungs: Clear throughout to auscultation Heart: Regular rate and rhythm; no murmurs, rubs or bruits Abdomen: Soft, non tender and non distended. No masses, hepatosplenomegaly or hernias noted. Normal Bowel sounds Rectal: Not done Musculoskeletal: Symmetrical with no gross deformities  Pulses:  Normal pulses noted Extremities: No clubbing, cyanosis, edema or deformities noted Neurological: Alert oriented x 4, grossly nonfocal Psychological:  Alert and cooperative. Normal mood and affect   Assessment and Recommendations:  1. GERD with LPR.  Continue Nexium 40 mg twice daily.  Follow standard antireflux measures. REV in 1 year.   2. RUQ pain, abdominal bloating, intestinal gas.  CBC, CMP, lipase, RUQ ultrasound.  Begin dicyclomine 10 mg 4 times daily as needed.  Gas-X 4 times daily as needed.  Trial of Align for 2 weeks and then [facility] for 2 weeks.  Trial  of other probiotics and prebiotics if these two are not effective.  She is advised to call if symptoms are not well controlled within several days.  3. CRC screening, average risk.  A 10-year interval screening colonoscopy is due in July 2022.

## 2019-09-28 NOTE — Patient Instructions (Addendum)
Your provider has requested that you go to the basement level for lab work before leaving today. Press "B" on the elevator. The lab is located at the first door on the left as you exit the elevator.  We have sent the following medications to your pharmacy for you to pick up at your convenience: bentyl and nexium.  You can take over the counter Gas-x four times a day as needed for gas and bloating.   Also, start a probiotic such as Clinical cytogeneticist daily.   You have been scheduled for an abdominal ultrasound at [facility] (1st floor of hospital) on 10/02/19 at 9:00am. Please arrive 15 minutes prior to your appointment for registration. Make certain not to have anything to eat or drink 6 hours prior to your appointment. Should you need to reschedule your appointment, please contact radiology at [phone]. This test typically takes about 30 minutes to perform.  Thank you for choosing me and [facility].  Pricilla Riffle. Dagoberto Ligas., MD., Marval Regal

## 2019-09-29 ENCOUNTER — Telehealth: Payer: Self-pay | Admitting: Gastroenterology

## 2019-09-29 NOTE — Telephone Encounter (Signed)
Pts [place] moved up to tomorrow morning at cone@ 8am, arrival time 7:45am. Pt to be NPO after midnight. Pt aware of appt.

## 2019-09-30 ENCOUNTER — Ambulatory Visit (HOSPITAL_COMMUNITY)
Admission: RE | Admit: 2019-09-30 | Discharge: 2019-09-30 | Disposition: A | Discharge status: Home / Self Care | Payer: BC Managed Care – PPO | Source: Ambulatory Visit | Attending: Gastroenterology | Admitting: Gastroenterology

## 2019-09-30 ENCOUNTER — Other Ambulatory Visit: Payer: Self-pay

## 2019-09-30 DIAGNOSIS — R1011 Right upper quadrant pain: Secondary | ICD-10-CM | POA: Insufficient documentation

## 2019-09-30 DIAGNOSIS — K219 Gastro-esophageal reflux disease without esophagitis: Secondary | ICD-10-CM | POA: Diagnosis present

## 2019-09-30 DIAGNOSIS — R140 Abdominal distension (gaseous): Secondary | ICD-10-CM | POA: Diagnosis present

## 2019-10-01 ENCOUNTER — Telehealth: Payer: Self-pay | Admitting: Gastroenterology

## 2019-10-01 ENCOUNTER — Other Ambulatory Visit: Payer: Self-pay

## 2019-10-01 DIAGNOSIS — R1011 Right upper quadrant pain: Secondary | ICD-10-CM

## 2019-10-01 NOTE — Telephone Encounter (Signed)
Called patient and gave her Dr. Lynne Leader comments and recommendation to take ibuprofen-600mg  TID for 5 days, then TID prn. Scheduled for CT-abd/pelvis w/contrast at [facility] on 10/06/19. Patient to arrive at 7:15am and be NPO 4 hours before. Asked  Her to pick-up 2 bottles of oral contrast at [facility] and drink the first bottle at 5:30am and second bottle at 6:30am

## 2019-10-01 NOTE — Telephone Encounter (Signed)
Blood work and RUQ US show no evidence of a gastrointestinal problem.  Schedule abdominal/pelvic CT  For now start ibuprofen 600 mg po tid for 5 days then tid prn for possible musculoskeletal pain

## 2019-10-01 NOTE — Telephone Encounter (Signed)
Please see below and advise.

## 2019-10-02 ENCOUNTER — Telehealth: Payer: Self-pay | Admitting: *Deleted

## 2019-10-02 ENCOUNTER — Ambulatory Visit (HOSPITAL_COMMUNITY): Payer: BC Managed Care – PPO

## 2019-10-02 ENCOUNTER — Telehealth: Payer: Self-pay | Admitting: Gastroenterology

## 2019-10-02 ENCOUNTER — Ambulatory Visit (INDEPENDENT_AMBULATORY_CARE_PROVIDER_SITE_OTHER): Payer: BC Managed Care – PPO | Admitting: Family Medicine

## 2019-10-02 ENCOUNTER — Encounter: Payer: Self-pay | Admitting: Family Medicine

## 2019-10-02 DIAGNOSIS — R109 Unspecified abdominal pain: Secondary | ICD-10-CM | POA: Diagnosis not present

## 2019-10-02 MED ORDER — VALACYCLOVIR HCL 1 G PO TABS: 1000.0000 mg | ORAL_TABLET | Freq: Three times a day (TID) | ORAL | 0 refills | Status: DC

## 2019-10-02 MED ORDER — GABAPENTIN 100 MG PO CAPS: 100.0000 mg | ORAL_CAPSULE | Freq: Three times a day (TID) | ORAL | 1 refills | Status: DC | PRN

## 2019-10-02 MED ORDER — LIDOCAINE 5 % EX PTCH: 1.0000 | MEDICATED_PATCH | CUTANEOUS | 0 refills | Status: DC

## 2019-10-02 MED ORDER — DICYCLOMINE HCL 10 MG PO CAPS: 10.0000 mg | ORAL_CAPSULE | Freq: Three times a day (TID) | ORAL | Status: DC

## 2019-10-02 NOTE — Progress Notes (Signed)
Virtual visit completed through WebEx or similar program Patient location: home  Provider location: Financial controller at [facility], office   Pandemic considerations d/w pt.   Limitations and rationale for visit method d/w patient.  Patient agreed to proceed.   CC: follow up.    HPI: sx started about 1 week ago.  Pain on the R side, just below the R ribs. Throbbing pain initially.  More pain the next night.  Saw GI in the meantime, had u/s done.  Started on bentyl in the meantime.  2 days ago started on miralax and also pain started to wrap around the R side, to the back, in dermatomal distribution.  No rash but skin her hypersensitive.  No fevers, no chills, no vomiting.  She has some HA.  No L sided sx.    IMPRESSION: Normal right upper quadrant ultrasound.  Meds and allergies reviewed.   ROS: Per HPI unless specifically indicated in ROS section   NAD Speech wnl  A/P: presumed shingles. Start valtrex, gabapentin, can try lidoderm with routine cautions.  She agrees.

## 2019-10-02 NOTE — Telephone Encounter (Signed)
Patient told to contact her PCP concerning the possibility of having shingles. No further questions by the conclusion of the call.

## 2019-10-02 NOTE — Telephone Encounter (Signed)
PA submitted through [facility] for Lidocaine Patches, awaiting response.

## 2019-10-04 DIAGNOSIS — R109 Unspecified abdominal pain: Secondary | ICD-10-CM | POA: Insufficient documentation

## 2019-10-04 NOTE — Assessment & Plan Note (Signed)
presumed shingles. Start valtrex, gabapentin, can try lidoderm with routine cautions.  She agrees.  She will update me as needed.  She is for evaluation goes pathophysiology.

## 2019-10-05 ENCOUNTER — Telehealth: Payer: Self-pay | Admitting: *Deleted

## 2019-10-05 NOTE — Telephone Encounter (Signed)
Patient advised.

## 2019-10-05 NOTE — Telephone Encounter (Signed)
Patient left a voicemail wanting to let Dr. Damita Dunnings know that the sores from the shingles came out Sunday morning. Patient wants to know if she is contagious and wanting to know when it is okay for her to go back to work? Patient also wants to know about extending her medications? Pharmacy [facility]

## 2019-10-05 NOTE — Telephone Encounter (Signed)
The rash should confirm the dx.  Wouldn't extend the valtrex.  Would take 1 week. Longer rx likely not useful unless she has new lesions erupting after finishing current rx.  If so, then update me.  Wouldn't expect symptom resolution by completion of valtrex.  Let me know how she does with pain with gabapentin and lidoderm.  We can extend/adjust those as needed.   She isn't contagious if the rash is covered.  Would be okay to go back to work if she keeps it covered.  Thanks.

## 2019-10-06 ENCOUNTER — Ambulatory Visit (HOSPITAL_COMMUNITY): Payer: BC Managed Care – PPO

## 2019-10-08 ENCOUNTER — Other Ambulatory Visit: Payer: Self-pay

## 2019-10-08 MED ORDER — VALACYCLOVIR HCL 1 G PO TABS: 1000.0000 mg | ORAL_TABLET | Freq: Three times a day (TID) | ORAL | 0 refills | Status: DC

## 2019-10-08 NOTE — Addendum Note (Signed)
Addended by: Tonia Ghent on: 10/08/2019 02:04 PM   Modules accepted: Orders

## 2019-10-08 NOTE — Telephone Encounter (Signed)
Error adding note to 10/05/19.

## 2019-10-08 NOTE — Telephone Encounter (Signed)
Pt left v/m that today is last day on valacyclovir and last night had a surge in pain with new outbreak of lesions. Pt wants to know if needs more valacyclovir with new outbreak. Pt request cb.

## 2019-10-08 NOTE — Telephone Encounter (Signed)
Patient advised.

## 2019-10-08 NOTE — Telephone Encounter (Signed)
Would restart valtrex given the new lesions.  If she isn't getting relief from the pain then let me know.  Thanks.

## 2019-10-27 ENCOUNTER — Telehealth: Payer: Self-pay | Admitting: *Deleted

## 2019-10-27 NOTE — Telephone Encounter (Signed)
PA for Lidoderm Patch submitted thru [facility], awaiting response.

## 2019-11-03 NOTE — Telephone Encounter (Signed)
PA approved for dates: 10/28/19-10/27/2022. Patient advised. This RX was sent in December for shingles. Patient is doing well and no longer needs this medication.

## 2019-11-11 ENCOUNTER — Ambulatory Visit: Payer: BC Managed Care – PPO | Attending: Internal Medicine

## 2019-11-11 DIAGNOSIS — Z20822 Contact with and (suspected) exposure to covid-19: Secondary | ICD-10-CM

## 2019-11-12 LAB — NOVEL CORONAVIRUS, NAA: SARS-CoV-2, NAA: NOT DETECTED

## 2019-12-20 ENCOUNTER — Ambulatory Visit: Payer: BC Managed Care – PPO | Attending: Internal Medicine

## 2019-12-20 DIAGNOSIS — Z23 Encounter for immunization: Secondary | ICD-10-CM

## 2019-12-20 NOTE — Progress Notes (Signed)
   Covid-19 Vaccination Clinic  Name:  Tanya Cobb    MRN: [id number] DOB: [birth year]  12/20/2019  Tanya Cobb was observed post Covid-19 immunization for 15 minutes without incidence. She was provided with Vaccine Information Sheet and instruction to access the V-Safe system.   Tanya Cobb was instructed to call 911 with any severe reactions post vaccine: Marland Kitchen Difficulty breathing  . Swelling of your face and throat  . A fast heartbeat  . A bad rash all over your body  . Dizziness and weakness    Immunizations Administered    Name Date Dose VIS Date Route   Pfizer COVID-19 Vaccine 12/20/2019  8:48 AM 0.3 mL 10/02/2019 Intramuscular   Manufacturer: [place]   Lot: [id number]   [place]: [id number]

## 2020-01-11 ENCOUNTER — Ambulatory Visit: Payer: BC Managed Care – PPO | Attending: Internal Medicine

## 2020-01-11 DIAGNOSIS — Z23 Encounter for immunization: Secondary | ICD-10-CM

## 2020-01-11 NOTE — Progress Notes (Signed)
   Covid-19 Vaccination Clinic  Name:  Tanya Cobb    MRN: [id number] DOB: August 27, 1966  01/11/2020  [name] was observed post Covid-19 immunization for 15 minutes without incident. She was provided with Vaccine Information Sheet and instruction to access the V-Safe system.   [name] was instructed to call 911 with any severe reactions post vaccine: Marland Kitchen Difficulty breathing  . Swelling of face and throat  . A fast heartbeat  . A bad rash all over body  . Dizziness and weakness   Immunizations Administered    Name Date Dose VIS Date Route   Pfizer COVID-19 Vaccine 01/11/2020  8:48 AM 0.3 mL 10/02/2019 Intramuscular   Manufacturer: [place]   Lot: [id number]   [place]: [id number]

## 2020-02-04 ENCOUNTER — Telehealth: Payer: BC Managed Care – PPO | Admitting: Family Medicine

## 2020-02-08 ENCOUNTER — Telehealth (INDEPENDENT_AMBULATORY_CARE_PROVIDER_SITE_OTHER): Payer: BC Managed Care – PPO | Admitting: Family Medicine

## 2020-02-08 ENCOUNTER — Encounter: Payer: Self-pay | Admitting: Family Medicine

## 2020-02-08 ENCOUNTER — Telehealth: Payer: Self-pay

## 2020-02-08 DIAGNOSIS — J0110 Acute frontal sinusitis, unspecified: Secondary | ICD-10-CM | POA: Diagnosis not present

## 2020-02-08 DIAGNOSIS — J019 Acute sinusitis, unspecified: Secondary | ICD-10-CM | POA: Insufficient documentation

## 2020-02-08 MED ORDER — AMOXICILLIN-POT CLAVULANATE 875-125 MG PO TABS: 1.0000 | ORAL_TABLET | Freq: Two times a day (BID) | ORAL | 0 refills | Status: DC

## 2020-02-08 MED ORDER — PREDNISONE 10 MG PO TABS: ORAL_TABLET | ORAL | 0 refills | Status: DC

## 2020-02-08 NOTE — Telephone Encounter (Signed)
[facility] - Client TELEPHONE ADVICE RECORD [facility] Patient Name: Tanya Cobb Gender: Female DOB: [birth year] Age: 54 Y 60 M 4 D Return Phone Number: [phone] (Primary) Address: City/State/Zip: [place] [address] [address] Day - Client Client Site [place] - Day Physician Renford Dills - MD Contact Type Call Who Is Calling Patient / Member / Family / Caregiver Call Type Triage / Clinical Relationship To Patient Self Return Phone Number [phone] (Primary) Chief Complaint CHEST PAIN (>=21 years) - pain, pressure, heaviness or tightness Reason for Call Symptomatic / Request for [place] states she is having tightness in her chest, shortness of breath, and chest pain (Call from office for triage). [place] Not Listed [facility] Translation No Nurse Assessment Nurse: Juleen China, RN, Butch Penny Date/Time Eilene Ghazi Time): 02/08/2020 8:34:38 AM Confirm and document reason for call. If symptomatic, describe symptoms. ---Caller states she is having tightness in her chest, cough and shortness of breath. Started this weekend. No fever. Has the patient had close contact with a person known or suspected to have the novel coronavirus illness OR traveled / lives in area with major community spread (including international travel) in the last 14 days from the onset of symptoms? * If Asymptomatic, screen for exposure and travel within the last 14 days. ---No Does the patient have any new or worsening symptoms? ---Yes Will a triage be completed? ---Yes Related visit to physician within the last 2 weeks? ---No Does the PT have any chronic conditions? (i.e. diabetes, asthma, this includes High risk factors for pregnancy, etc.) ---No Is this a behavioral health or substance abuse call? ---No Guidelines Guideline Title Affirmed Question Affirmed Notes  Nurse Date/Time Eilene Ghazi Time) Chest Pain Difficulty breathing Criselda Peaches 02/08/2020 8:36:39 AM Disp. Time Eilene Ghazi Time) Disposition Final User 02/08/2020 8:32:08 AM Send to Urgent Queue Rulon Eisenmenger 02/08/2020 8:39:54 AM Go to ED Now Yes Juleen China, RN, Annitta Jersey NOTE: All timestamps contained within this report are represented as [place] Standard Time. CONFIDENTIALTY NOTICE: This fax transmission is intended only for the addressee. It contains information that is legally privileged, confidential or otherwise protected from use or disclosure. If you are not the intended recipient, you are strictly prohibited from reviewing, disclosing, copying using or disseminating any of this information or taking any action in reliance on or regarding this information. If you have received this fax in error, please notify us immediately by telephone so that we can arrange for its return to [place]. Phone: [phone], Toll-Free: [phone], Fax: [phone] Page: 2 of 2 Call Id: [id number] [place] Disagree/Comply Comply Caller Understands Yes PreDisposition Call Doctor Care Advice Given Per Guideline GO TO ED NOW: * You need to be seen in the Emergency Department. BRING MEDICINES: * Please bring a list of your current medicines when you go to the Emergency Department (ER). * It is also a good idea to bring the pill bottles too. This will help the doctor to make certain you are taking the right medicines and the right dose. NOTHING BY MOUTH: * Do not eat or drink anything for now. CALL EMS IF: * Severe difficulty breathing occurs * Passes out or becomes too weak to stand * You become worse. CARE ADVICE given per Chest Pain (Adult) guideline. Comments User: Jennye Moccasin, RN Date/Time Eilene Ghazi Time): 02/08/2020 8:42:36 AM Caller states she feels like her airway is closing especially at night. Referrals GO TO FACILITY OTHER - SPECIFY

## 2020-02-08 NOTE — Progress Notes (Signed)
Virtual Visit via Video Note  I connected with Tanya Cobb on 02/08/20 at  3:00 PM EDT by a video enabled telemedicine application and verified that I am speaking with the correct person using two identifiers.  Location: Patient: work Provider: office    I discussed the limitations of evaluation and management by telemedicine and the availability of in person appointments. The patient expressed understanding and agreed to proceed.  Parties involved in encounter  Patient: Tanya Cobb   Provider:  Loura Pardon MD    History of Present Illness: Pt presents with uri symptoms /sinus discomfort   54 yo pt of Dr Damita Dunnings who had her covid vaccine (finished on 3/22)  Temp: 97.9 F (36.6 C)   Started 2 weeks ago as a head cold  Then started going into her chest   More difficult to breathe -esp when lying down  No wheezing    (did have asthma as a child)  Cough is mix of prod/dry-now it has subsided somewhat   Hoarse voice  Had a sore throat early on    (caught from nephew)  Tired -no energy   Sinus pain /pressure (frontal)  Nasal rinse 1-2 times per day  guifen as well , then mucinex pm so she could sleep  Nasal d/c is yellow /green  Used afrin a few times   No fever  Has had a headache  Taste/smell is about the same   Has been tested for covid twice last week and was neg   Patient Active Problem List   Diagnosis Date Noted  . Acute sinusitis 02/08/2020  . Abdominal wall pain 10/04/2019  . 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 Other social stressor 11/22/2017  . PAC (premature atrial contraction) 12/25/2016  . Skin lesion 05/13/2015  . GERD (W/U BY DR Fuller Plan, SEE NOTE 10/13/08) 10/13/2008   Past Medical History:  Diagnosis Date  . Breast cancer ([place]) 12/2017  . Cervical strain   . GERD (gastroesophageal reflux disease)    w/ LPR   Past Surgical History:  Procedure Laterality Date  . BREAST LUMPECTOMY  12/2017  . NECK SURGERY  2008   . SHOULDER ARTHROSCOPY  2016   Social History   Tobacco Use  . Smoking status: Never Smoker  . Smokeless tobacco: Never Used  Substance Use Topics  . Alcohol use: Yes    Comment: 1-2 drinks per day  . Drug use: No   Family History  Problem Relation Age of Onset  . Diabetes Other        [employer]  . Ovarian cancer Maternal Grandmother   . Heart disease Maternal Grandmother        and MGF  . Colon cancer Other        Great Grandmother  . Alzheimer's disease Father    No Known Allergies Current Outpatient Medications on File Prior to Visit  Medication Sig Dispense Refill  . calcium carbonate (OS-CAL) 600 MG TABS Take 600 mg by mouth daily.    Marland Kitchen dicyclomine (BENTYL) 10 MG capsule Take 1 capsule (10 mg total) by mouth 4 (four) times daily -  before meals and at bedtime.    Marland Kitchen esomeprazole (NEXIUM) 40 MG capsule TAKE ONE (1) CAPSULE BY MOUTH 2 TIMES DAILY BEFORE A MEAL 60 capsule 11  . fluocinonide (LIDEX) 0.05 % external solution Apply to skin twice a day apply to scalp x 2 weeks, as needed for flares    . gabapentin (NEURONTIN)  100 MG capsule Take 1-3 capsules (100-300 mg total) by mouth 3 (three) times daily as needed (for pain). 50 capsule 1  . lidocaine (LIDODERM) 5 % Place 1 patch onto the skin daily. Remove & Discard patch within 12 hours or as directed by MD.  Dx shingles 30 patch 0  . Multiple Vitamin (MULTIVITAMIN) tablet Take 1 tablet by mouth daily.      . Omega-3 Fatty Acids (FISH OIL) 1000 MG CAPS Take 1 capsule by mouth daily.      . valACYclovir (VALTREX) 1000 MG tablet Take 1 tablet (1,000 mg total) by mouth 3 (three) times daily. 21 tablet 0   No current facility-administered medications on file prior to visit.   Review of Systems  Constitutional: Positive for malaise/fatigue. Negative for chills and fever.  HENT: Positive for congestion and sinus pain. Negative for ear pain and sore throat.   Eyes: Negative for blurred vision, discharge and redness.   Respiratory: Positive for cough and shortness of breath. Negative for sputum production, wheezing and stridor.   Cardiovascular: Negative for chest pain, palpitations and leg swelling.       Chest tightness with breathing  Gastrointestinal: Negative for abdominal pain, diarrhea, nausea and vomiting.  Musculoskeletal: Negative for myalgias.  Skin: Negative for rash.  Neurological: Positive for headaches. Negative for dizziness.    Observations/Objective: Patient appears well, in no distress Weight is baseline  No facial swelling or asymmetry Mildly hoarse voice  Notes sinus fullness/pain is above brows on both sides of forehead  No obvious tremor or mobility impairment Moving neck and UEs normally Able to hear the call well  No cough or shortness of breath during interview  Talkative and mentally sharp with no cognitive changes No skin changes on face or neck , no rash or pallor Affect is normal    Assessment and Plan: Problem List Items Addressed This Visit      Respiratory   Acute sinusitis    S/p uri 2 weeks ago (with two negative covid tests and immunized status)  Px augmentin for infection Prednisone for congestion and chest tightness Continue the expectorant/mucinex products and sinus rinse  Update if not starting to improve in a week or if worsening        Relevant Medications   amoxicillin-clavulanate (AUGMENTIN) 875-125 MG tablet   predniSONE (DELTASONE) 10 MG tablet       Follow Up Instructions: Take augmentin and prednisone for a sinus infection (should also help the tight chest)  Drink fluids  Over the counter mucinex products and sinus rinse are ok  Update if not starting to improve in a week or if worsening     I discussed the assessment and treatment plan with the patient. The patient was provided an opportunity to ask questions and all were answered. The patient agreed with the plan and demonstrated an understanding of the instructions.   The patient  was advised to call back or seek an in-person evaluation if the symptoms worsen or if the condition fails to improve as anticipated.     Loura Pardon, MD

## 2020-02-08 NOTE — Patient Instructions (Signed)
Take augmentin and prednisone for a sinus infection (should also help the tight chest)  Drink fluids  Over the counter mucinex products and sinus rinse are ok  Update if not starting to improve in a week or if worsening

## 2020-02-08 NOTE — Telephone Encounter (Signed)
Noted. Thanks.

## 2020-02-08 NOTE — Telephone Encounter (Signed)
Pt already has my chart appt with Dr Glori Bickers today at [place]. I spoke with pt and she said she is not having CP, but having some SOB and tightness in chest but pt thinks that is due to congestion in chest. Pt has had nurse at work listen to her lungs and will let Dr Glori Bickers know what the nurse said at the virtual appt. ED precautions given and pt voiced understanding. FYI to Dr Glori Bickers.

## 2020-02-08 NOTE — Assessment & Plan Note (Signed)
S/p uri 2 weeks ago (with two negative covid tests and immunized status)  Px augmentin for infection Prednisone for congestion and chest tightness Continue the expectorant/mucinex products and sinus rinse  Update if not starting to improve in a week or if worsening

## 2020-02-10 ENCOUNTER — Encounter: Payer: Self-pay | Admitting: Family Medicine

## 2020-02-12 ENCOUNTER — Encounter: Payer: Self-pay | Admitting: Family Medicine

## 2020-03-10 ENCOUNTER — Other Ambulatory Visit: Payer: Self-pay

## 2020-03-10 ENCOUNTER — Ambulatory Visit
Admission: RE | Admit: 2020-03-10 | Discharge: 2020-03-10 | Disposition: A | Discharge status: Home / Self Care | Payer: BC Managed Care – PPO | Source: Ambulatory Visit | Attending: Obstetrics and Gynecology | Admitting: Obstetrics and Gynecology

## 2020-03-10 DIAGNOSIS — Z9889 Other specified postprocedural states: Secondary | ICD-10-CM

## 2020-03-10 HISTORY — DX: Personal history of irradiation: Z92.3

## 2020-10-06 IMAGING — [facility]  DIGITAL DIAGNOSTIC BREAST BILAT IMPLANT W/ TOMO W/ CAD
2 series · 3 of 6 positions shown · non-contrast
Comparison: Previous exam(s).

CLINICAL DATA: 53-year-old female presenting for routine annual
surveillance status post right breast lumpectomy in the right breast
in 9232.

EXAM:
2D DIGITAL DIAGNOSTIC RIGHT MAMMOGRAM WITH IMPLANTS, CAD AND ADJUNCT
TOMO
The patient has retropectoral implants. Standard and implant
displaced views were performed.

[L CC synth-2D]
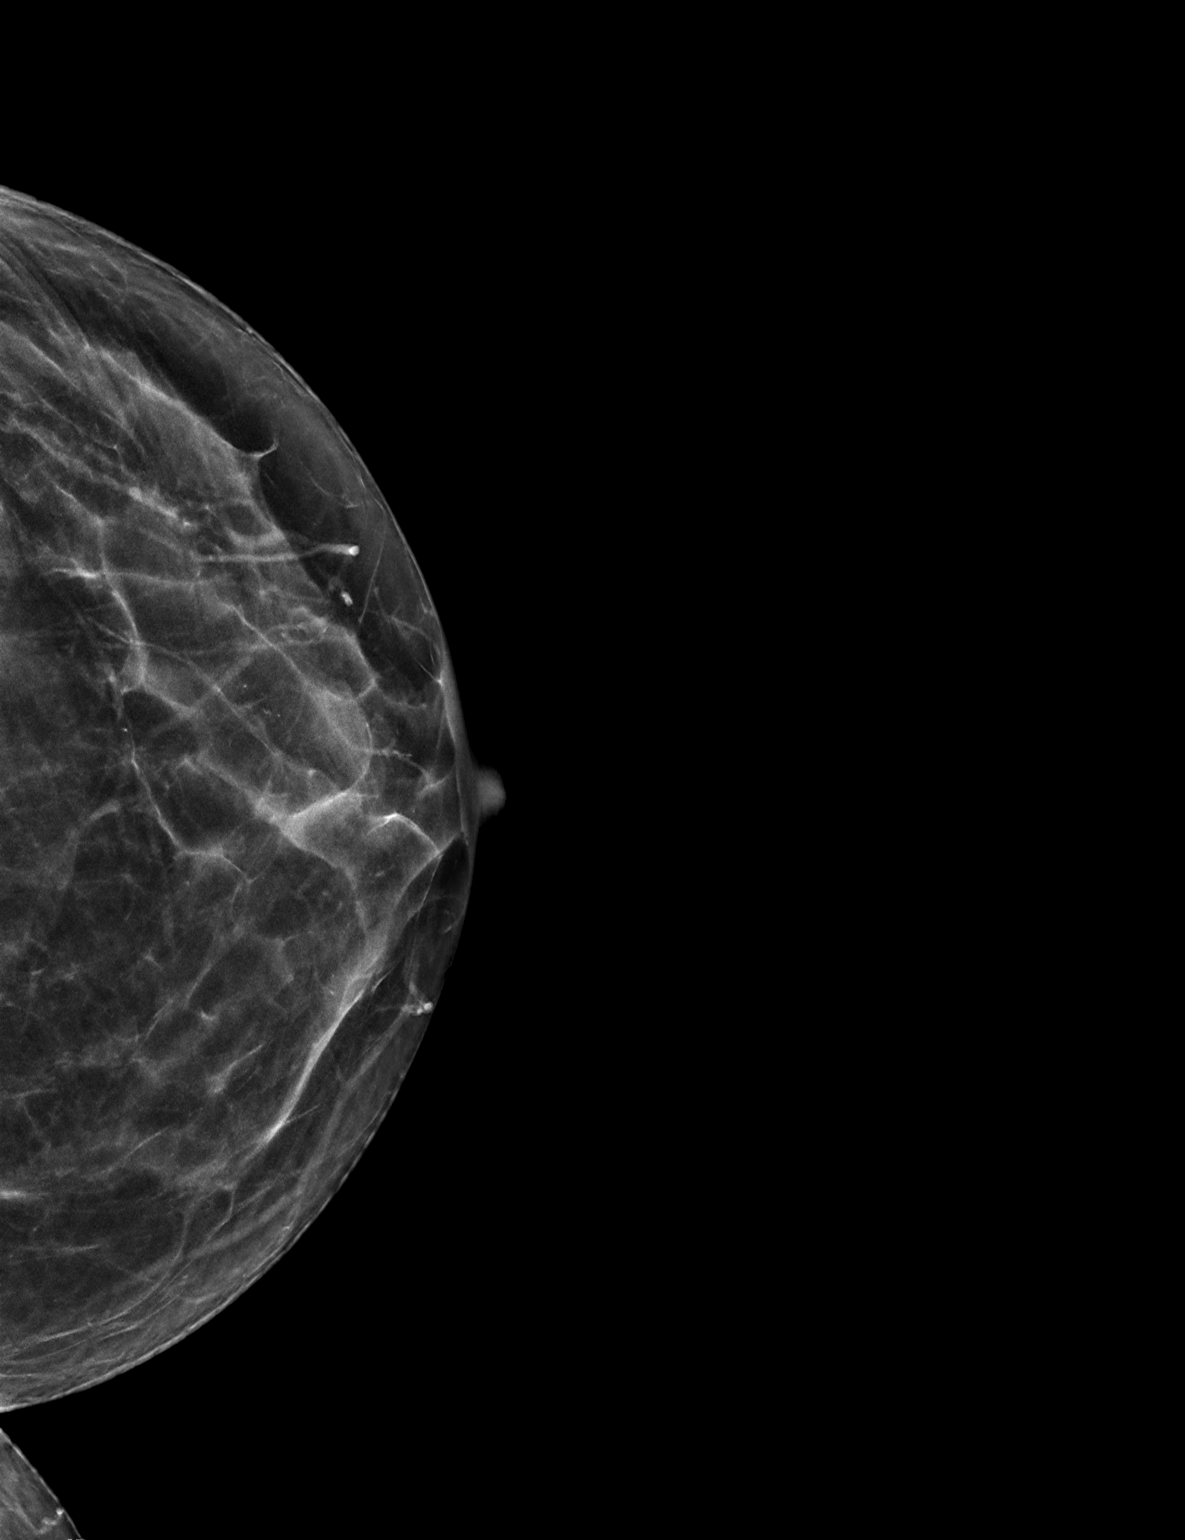

[L CCID BREAST TOMOSYNTHESIS IMAGE tomo · 2 of 50 frames shown]
[frame 17/50]
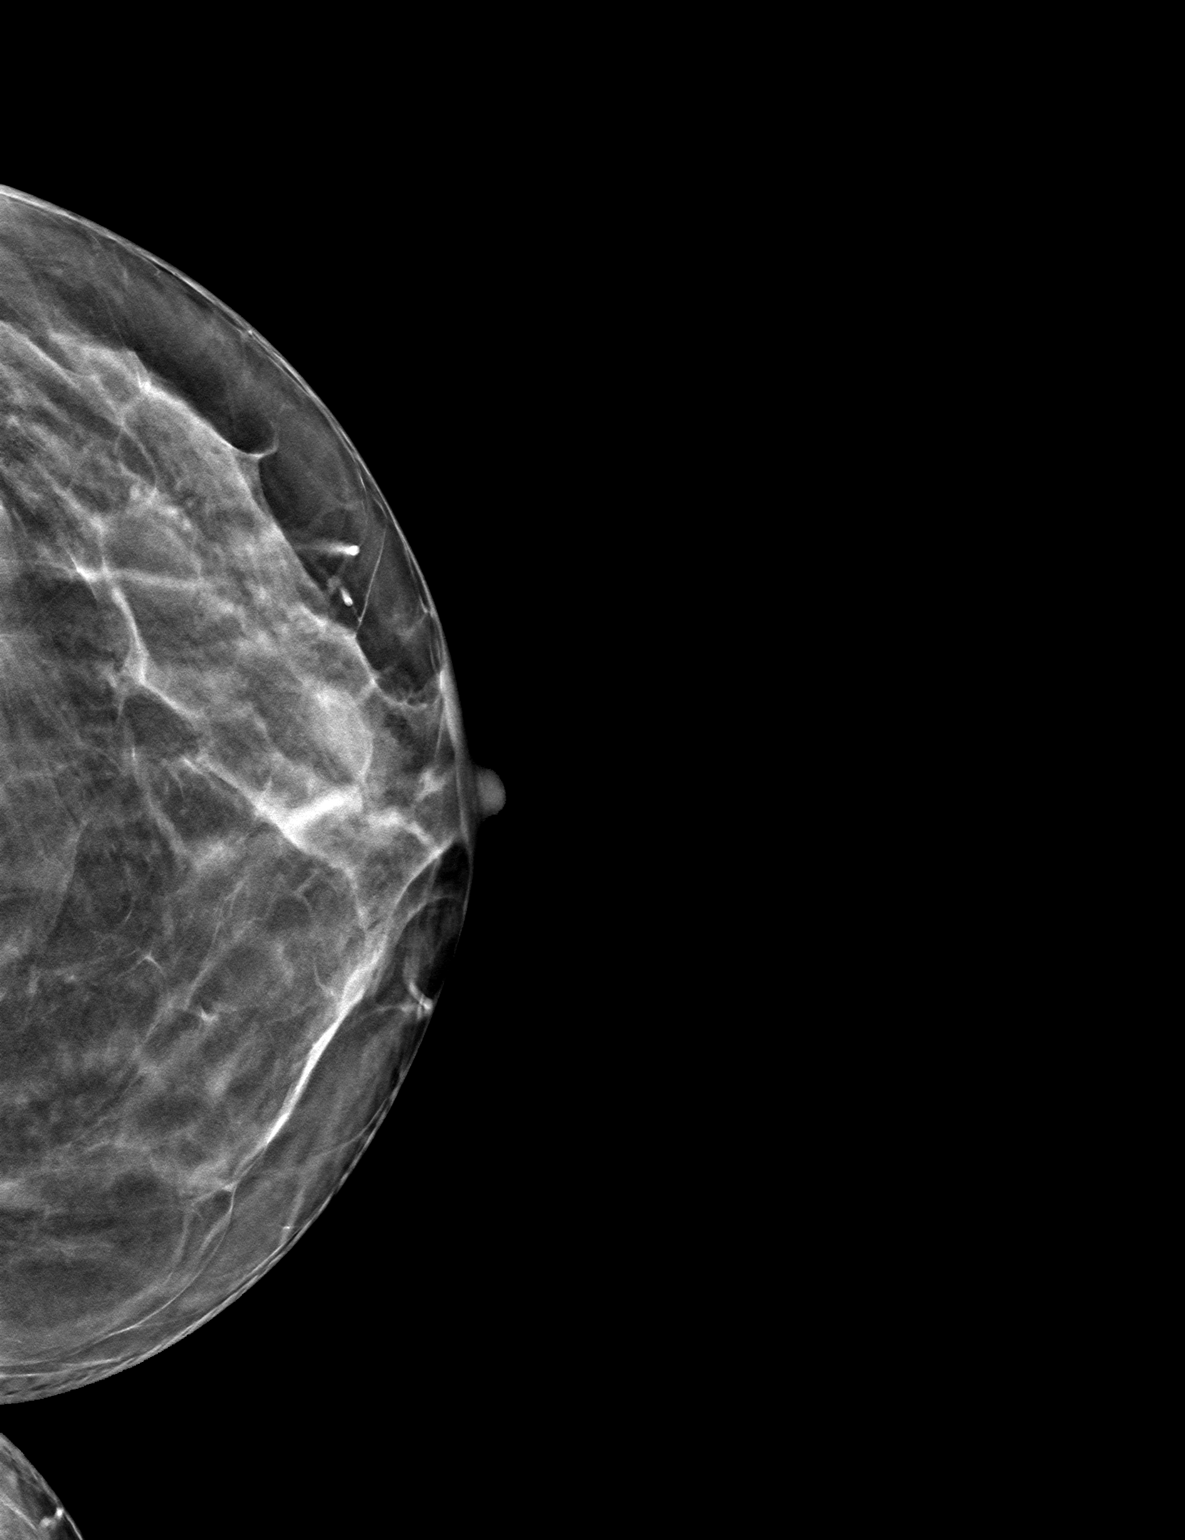
[frame 25/50]
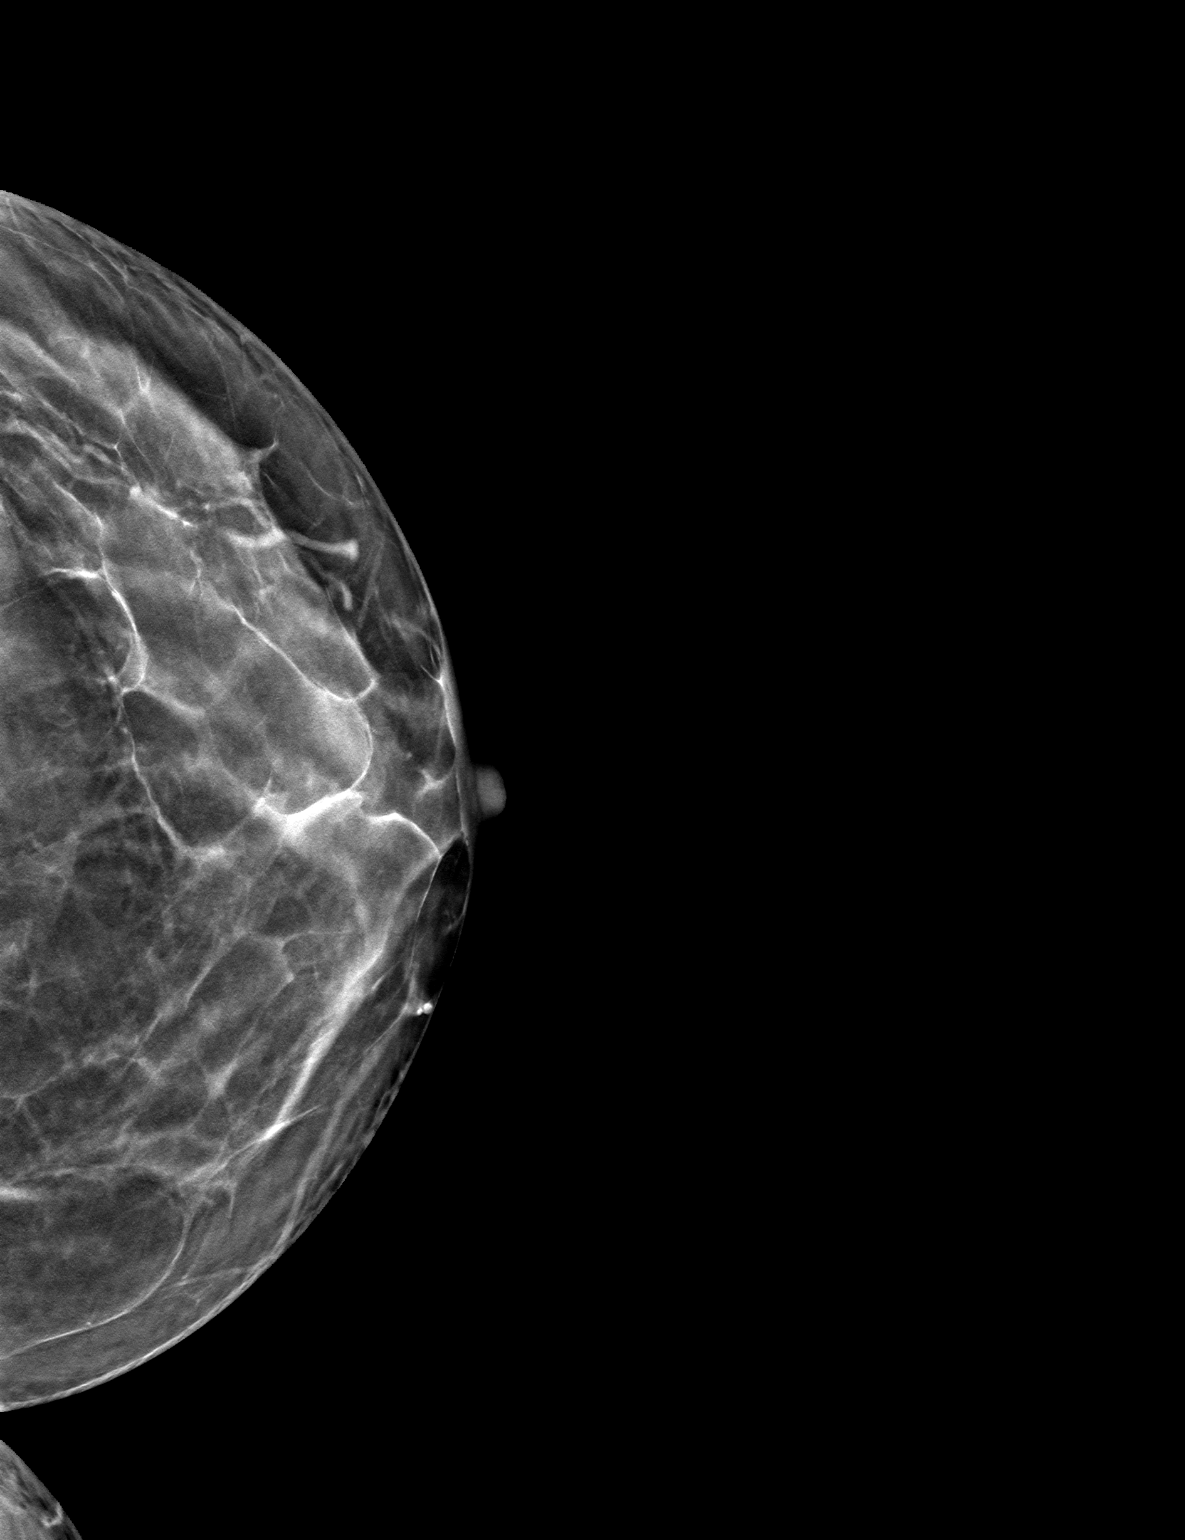

[3 of 6 positions shown; findings below may reference images not displayed]

ACR Breast Density Category c: The breast tissue is heterogeneously
dense, which may obscure small masses.
FINDINGS: The right breast lumpectomy site is stable. No suspicious
calcifications, masses or areas of distortion are seen in the
bilateral breasts.

Mammographic images were processed with CAD.
IMPRESSION: Stable right breast lumpectomy site. No mammographic evidence of
malignancy in the bilateral breasts.

RECOMMENDATION:
Diagnostic mammogram is suggested in 1 year. (Code:01-0-J5L)

I have discussed the findings and recommendations with the patient.
If applicable, a reminder letter will be sent to the patient
regarding the next appointment.

BI-RADS CATEGORY  2: Benign.

## 2020-11-25 ENCOUNTER — Other Ambulatory Visit: Payer: Self-pay | Admitting: Gastroenterology

## 2020-12-02 ENCOUNTER — Other Ambulatory Visit: Payer: Self-pay | Admitting: Obstetrics and Gynecology

## 2020-12-02 DIAGNOSIS — Z1231 Encounter for screening mammogram for malignant neoplasm of breast: Secondary | ICD-10-CM

## 2020-12-06 ENCOUNTER — Other Ambulatory Visit: Payer: Self-pay | Admitting: Obstetrics and Gynecology

## 2020-12-06 DIAGNOSIS — Z853 Personal history of malignant neoplasm of breast: Secondary | ICD-10-CM

## 2020-12-27 ENCOUNTER — Ambulatory Visit: Payer: BC Managed Care – PPO | Admitting: Gastroenterology

## 2020-12-27 ENCOUNTER — Encounter: Payer: Self-pay | Admitting: Gastroenterology

## 2020-12-27 VITALS — BP 98/70 | HR 78 | Ht 67.0 in | Wt 139.4 lb

## 2020-12-27 DIAGNOSIS — Z1211 Encounter for screening for malignant neoplasm of colon: Secondary | ICD-10-CM

## 2020-12-27 DIAGNOSIS — K219 Gastro-esophageal reflux disease without esophagitis: Secondary | ICD-10-CM | POA: Diagnosis not present

## 2020-12-27 MED ORDER — ESOMEPRAZOLE MAGNESIUM 40 MG PO CPDR: DELAYED_RELEASE_CAPSULE | ORAL | 11 refills | Status: DC

## 2020-12-27 NOTE — Progress Notes (Signed)
    History of Present Illness: This is a 55 year old female GERD with LPR returning for follow-up.  Symptoms are well controlled on Nexium twice daily.  She occasionally tries to manage with once daily Nexium her symptoms generally returned within a few days and she resumes twice daily.  No other gastrointestinal complaints.  Denies weight loss, abdominal pain, constipation, diarrhea, change in stool caliber, melena, hematochezia, nausea, vomiting, dysphagia, chest pain.   Current Medications, Allergies, Past Medical History, Past Surgical History, Family History and Social History were reviewed in [employer] record.   Physical Exam: General: Well developed, well nourished, no acute distress Head: Normocephalic and atraumatic Eyes: Sclerae anicteric, EOMI Ears: Normal auditory acuity Mouth: Not examined, mask on during Covid-19 pandemic Lungs: Clear throughout to auscultation Heart: Regular rate and rhythm; no murmurs, rubs or bruits Abdomen: Soft, non tender and non distended. No masses, hepatosplenomegaly or hernias noted. Normal Bowel sounds Rectal: Deferred to colonoscopy  Musculoskeletal: Symmetrical with no gross deformities  Pulses:  Normal pulses noted Extremities: No clubbing, cyanosis, edema or deformities noted Neurological: Alert oriented x 4, grossly nonfocal Psychological:  Alert and cooperative. Normal mood and affect   Assessment and Recommendations:  1. GERD with LPR.  Follow antireflux measures and continue Nexium 40 mg p.o. twice daily. REV in 1 year.  2. CRC screening, average risk.  Screening colonoscopy is due in July 2022.  She is advised to schedule sometime this summer at her convenience.

## 2020-12-27 NOTE — Patient Instructions (Signed)
We have sent the following medications to your pharmacy for you to pick up at your convenience: Nexium.  You will be due for a recall colonoscopy in 04/2021. We will send you a reminder in the mail when it gets closer to that time.  Thank you for choosing me and [facility].  Pricilla Riffle. Dagoberto Ligas., MD., Marval Regal

## 2021-03-09 ENCOUNTER — Encounter: Payer: Self-pay | Admitting: Gastroenterology

## 2021-03-13 ENCOUNTER — Other Ambulatory Visit: Payer: Self-pay | Admitting: Obstetrics and Gynecology

## 2021-03-13 ENCOUNTER — Other Ambulatory Visit: Payer: Self-pay

## 2021-03-13 ENCOUNTER — Ambulatory Visit
Admission: RE | Admit: 2021-03-13 | Discharge: 2021-03-13 | Disposition: A | Discharge status: Home / Self Care | Payer: BC Managed Care – PPO | Source: Ambulatory Visit | Attending: Obstetrics and Gynecology | Admitting: Obstetrics and Gynecology

## 2021-03-13 DIAGNOSIS — Z853 Personal history of malignant neoplasm of breast: Secondary | ICD-10-CM

## 2021-05-22 DIAGNOSIS — D126 Benign neoplasm of colon, unspecified: Secondary | ICD-10-CM

## 2021-05-22 HISTORY — DX: Benign neoplasm of colon, unspecified: D12.6

## 2021-06-05 ENCOUNTER — Other Ambulatory Visit: Payer: Self-pay

## 2021-06-05 ENCOUNTER — Ambulatory Visit (AMBULATORY_SURGERY_CENTER): Payer: BC Managed Care – PPO | Admitting: *Deleted

## 2021-06-05 VITALS — Ht 67.0 in | Wt 135.0 lb

## 2021-06-05 DIAGNOSIS — Z1211 Encounter for screening for malignant neoplasm of colon: Secondary | ICD-10-CM

## 2021-06-05 MED ORDER — PEG 3350-KCL-NA BICARB-NACL 420 G PO SOLR: 4000.0000 mL | Freq: Once | ORAL | 0 refills | Status: AC

## 2021-06-05 NOTE — Progress Notes (Signed)
No egg or soy allergy known to patient  issues with past sedation with any surgeries or procedures of PONV  Patient denies ever being told they had issues or difficulty with intubation  No FH of Malignant Hyperthermia No diet pills per patient No home 02 use per patient  No blood thinners per patient  Pt denies issues with constipation  No A fib or A flutter  EMMI video to pt or via [place] 19 guidelines implemented in PV today with Pt and RN  Pt is fully vaccinated  for Covid   Due to the COVID-19 pandemic we are asking patients to follow certain guidelines.  Pt aware of COVID protocols and [facility] guidelines   Pt verified name, DOB, address and insurance during PV today. Pt mailed instruction packet to included paper to complete and mail back to [facility] with addressed and stamped envelope, Emmi video, copy of consent form to read and not return, and instructions.  Pt encouraged to call with questions or issues.  My Chart instructions to pt as well    Pt given the option in PV today for Golytely prep verses  alternative prep  ( Suprep/Plenvu)-  Pt is aware the Golytely has more volume but is more cost effective and the Suprep/Plenvu is less volume but may cost $90-150.  Pt voiced understanding of this and choose Golytely  Prep.

## 2021-06-06 ENCOUNTER — Telehealth: Payer: Self-pay | Admitting: Gastroenterology

## 2021-06-06 DIAGNOSIS — Z1211 Encounter for screening for malignant neoplasm of colon: Secondary | ICD-10-CM

## 2021-06-06 MED ORDER — PLENVU 140 G PO SOLR: 1.0000 | ORAL | 0 refills | Status: DC

## 2021-06-06 NOTE — Telephone Encounter (Signed)
Pt had a PV yesterday- she was given the option for prep and decided on Golytely due to cost-   call back today and changed her mind- decided on Plenvu - I sent in Plenvu to [facility]- I did Plenvu instructions to her My Chart and also printed and mailed new instructions with a Plenvu Coupon

## 2021-06-06 NOTE — Telephone Encounter (Signed)
Inbound call from patient wanting to switch prep to Suprep/Plenvu.  Please advise.

## 2021-06-13 ENCOUNTER — Telehealth: Payer: Self-pay | Admitting: Gastroenterology

## 2021-06-13 NOTE — Telephone Encounter (Signed)
Patient reports that since starting on her fluticasone/azelastine spray and Shirley Friar she has worsening reflux.  She is wondering if these two medications could cause GERD.  I found a study on Xiidra with findings that in the first month of Xiidra incidence or reflux is increased 30 % especially in women over 31. Study states that with continued use the GERD symptoms improved.  The nasal spray also can have a side effect of GERD.  She is going to try holding one of them at a time and seeing if her symptoms improve.  If she tries holding both meds with no improvement in her GERD she will call back.

## 2021-06-13 NOTE — Telephone Encounter (Signed)
Inbound call from patient. States her acid reflux acts up when she takes nasal spray and eye drops. She is requesting a call back to discuss whether she should call her other doctors who prescribed the medications to stop them in regards to her reflux

## 2021-06-14 ENCOUNTER — Encounter: Payer: Self-pay | Admitting: Gastroenterology

## 2021-06-19 ENCOUNTER — Other Ambulatory Visit: Payer: Self-pay | Admitting: Gastroenterology

## 2021-06-19 ENCOUNTER — Encounter: Payer: Self-pay | Admitting: Gastroenterology

## 2021-06-19 ENCOUNTER — Other Ambulatory Visit: Payer: Self-pay

## 2021-06-19 ENCOUNTER — Ambulatory Visit (AMBULATORY_SURGERY_CENTER): Payer: BC Managed Care – PPO | Admitting: Gastroenterology

## 2021-06-19 VITALS — BP 109/67 | HR 55 | Temp 96.9°F | Resp 14 | Ht 67.0 in

## 2021-06-19 DIAGNOSIS — Z1211 Encounter for screening for malignant neoplasm of colon: Secondary | ICD-10-CM | POA: Diagnosis not present

## 2021-06-19 DIAGNOSIS — D123 Benign neoplasm of transverse colon: Secondary | ICD-10-CM

## 2021-06-19 MED ORDER — SODIUM CHLORIDE 0.9 % IV SOLN: 500.0000 mL | Freq: Once | INTRAVENOUS | Status: DC

## 2021-06-19 NOTE — Progress Notes (Signed)
Called to room to assist during endoscopic procedure.  Patient ID and intended procedure confirmed with present staff. Received instructions for my participation in the procedure from the performing physician.  

## 2021-06-19 NOTE — Patient Instructions (Signed)
High fiber diet. Awaiting pathology results. Will schedule repeat colonoscopy for surveillance based on pathology results.  YOU HAD AN ENDOSCOPIC PROCEDURE TODAY AT [facility]:   Refer to the procedure report that was given to you for any specific questions about what was found during the examination.  If the procedure report does not answer your questions, please call your gastroenterologist to clarify.  If you requested that your care partner not be given the details of your procedure findings, then the procedure report has been included in a sealed envelope for you to review at your convenience later.  YOU SHOULD EXPECT: Some feelings of bloating in the abdomen. Passage of more gas than usual.  Walking can help get rid of the air that was put into your GI tract during the procedure and reduce the bloating. If you had a lower endoscopy (such as a colonoscopy or flexible sigmoidoscopy) you may notice spotting of blood in your stool or on the toilet paper. If you underwent a bowel prep for your procedure, you may not have a normal bowel movement for a few days.  Please Note:  You might notice some irritation and congestion in your nose or some drainage.  This is from the oxygen used during your procedure.  There is no need for concern and it should clear up in a day or so.  SYMPTOMS TO REPORT IMMEDIATELY:  Following lower endoscopy (colonoscopy or flexible sigmoidoscopy):  Excessive amounts of blood in the stool  Significant tenderness or worsening of abdominal pains  Swelling of the abdomen that is new, acute  Fever of 100F or higher  For urgent or emergent issues, a gastroenterologist can be reached at any hour by calling [phone]. Do not use MyChart messaging for urgent concerns.    DIET:  We do recommend a small meal at first, but then you may proceed to your regular diet.  Drink plenty of fluids but you should avoid alcoholic beverages for 24 hours.  ACTIVITY:  You  should plan to take it easy for the rest of today and you should NOT DRIVE or use heavy machinery until tomorrow (because of the sedation medicines used during the test).    FOLLOW UP: Our staff will call the number listed on your records 48-72 hours following your procedure to check on you and address any questions or concerns that you may have regarding the information given to you following your procedure. If we do not reach you, we will leave a message.  We will attempt to reach you two times.  During this call, we will ask if you have developed any symptoms of COVID 19. If you develop any symptoms (ie: fever, flu-like symptoms, shortness of breath, cough etc.) before then, please call [phone].  If you test positive for Covid 19 in the 2 weeks post procedure, please call and report this information to [place].    If any biopsies were taken you will be contacted by phone or by letter within the next 1-3 weeks.  Please call us at [phone] if you have not heard about the biopsies in 3 weeks.    SIGNATURES/CONFIDENTIALITY: You and/or your care partner have signed paperwork which will be entered into your electronic medical record.  These signatures attest to the fact that that the information above on your After Visit Summary has been reviewed and is understood.  Full responsibility of the confidentiality of this discharge information lies with you and/or your care-partner.

## 2021-06-19 NOTE — Progress Notes (Signed)
VS by CW  I have reviewed the patient's medical history in detail and updated the computerized patient record.  

## 2021-06-19 NOTE — Progress Notes (Signed)
Report given to PACU, vss 

## 2021-06-19 NOTE — Op Note (Signed)
[place] Patient Name: Tanya Cobb Procedure Date: 06/19/2021 8:50 AM MRN: [id number] Endoscopist: Ladene Artist , MD Age: 55 Referring MD:  Date of Birth: Nov 25, 1965 Gender: Female Account #: [id number] Procedure:                Colonoscopy Indications:              Screening for colorectal malignant neoplasm Medicines:                Monitored Anesthesia Care Procedure:                Pre-Anesthesia Assessment:                           - Prior to the procedure, a History and Physical                            was performed, and patient medications and                            allergies were reviewed. The patient's tolerance of                            previous anesthesia was also reviewed. The risks                            and benefits of the procedure and the sedation                            options and risks were discussed with the patient.                            All questions were answered, and informed consent                            was obtained. Prior Anticoagulants: The patient has                            taken no previous anticoagulant or antiplatelet                            agents. ASA Grade Assessment: II - A patient with                            mild systemic disease. After reviewing the risks                            and benefits, the patient was deemed in                            satisfactory condition to undergo the procedure.                           After obtaining informed consent, the colonoscope  was passed under direct vision. Throughout the                            procedure, the patient's blood pressure, pulse, and                            oxygen saturations were monitored continuously. The                            Colonoscope was introduced through the anus and                            advanced to the the cecum, identified by                            appendiceal orifice and  ileocecal valve. The                            ileocecal valve, appendiceal orifice, and rectum                            were photographed. The quality of the bowel                            preparation was excellent. The colonoscopy was                            performed without difficulty. The patient tolerated                            the procedure well. Scope In: 9:11:17 AM Scope Out: 9:25:52 AM Total Procedure Duration: 0 hours 14 minutes 35 seconds  Findings:                 The perianal and digital rectal examinations were                            normal.                           A 7 mm polyp was found in the hepatic flexure. The                            polyp was sessile. The polyp was removed with a                            cold snare. Resection and retrieval were complete.                           A few small-mouthed diverticula were found in the                            left colon. There was no evidence of diverticular  bleeding.                           Internal hemorrhoids were found during                            retroflexion. The hemorrhoids were moderate and                            Grade I (internal hemorrhoids that do not prolapse).                           The exam was otherwise without abnormality on                            direct and retroflexion views. Complications:            No immediate complications. Estimated blood loss:                            None. Estimated Blood Loss:     Estimated blood loss: none. Impression:               - One 7 mm polyp at the hepatic flexure, removed                            with a cold snare. Resected and retrieved.                           - Mild diverticulosis in the left colon.                           - Internal hemorrhoids.                           - The examination was otherwise normal on direct                            and retroflexion views. Recommendation:            - Repeat colonoscopy after studies are complete for                            surveillance based on pathology results.                           - Patient has a contact number available for                            emergencies. The signs and symptoms of potential                            delayed complications were discussed with the                            patient. Return to normal activities tomorrow.  Written discharge instructions were provided to the                            patient.                           - High fiber diet.                           - Continue present medications.                           - Await pathology results. Ladene Artist, MD 06/19/2021 9:28:39 AM This report has been signed electronically.

## 2021-06-19 NOTE — Progress Notes (Signed)
History & Physical  Primary Care Physician:  Tonia Ghent, MD Primary Gastroenterologist: Jerilynn Mages. Fuller Plan, MD  CHIEF COMPLAINT:  CRC screening  HPI: Tanya Cobb is a 55 y.o. female who presents for colonoscopy for CRC screening, average risk. Last colonoscopy in 2012 was normal.    Past Medical History:  Diagnosis Date   Arthritis    Breast cancer ([place]) 12/2017   Cervical strain    GERD (gastroesophageal reflux disease)    w/ LPR   Osteopenia    Personal history of radiation therapy    PONV (postoperative nausea and vomiting)     Past Surgical History:  Procedure Laterality Date   AUGMENTATION MAMMAPLASTY Bilateral    BREAST LUMPECTOMY  12/2017   COLONOSCOPY  05/09/2011   Eye lid surgery     NECK SURGERY  2008   SHOULDER ARTHROSCOPY  2016   UPPER GASTROINTESTINAL ENDOSCOPY  07/21/2018    Prior to Admission medications   Medication Sig Start Date End Date Taking? Authorizing Provider  calcium carbonate (OS-CAL) 600 MG TABS Take 600 mg by mouth daily.   Yes [provider]  COLLAGEN PO Take by mouth. Powder daily   Yes [provider]  esomeprazole (NEXIUM) 40 MG capsule TAKE ONE (1) CAPSULE BY MOUTH 2 TIMES DAILY BEFORE A MEAL 12/27/20  Yes Ladene Artist, MD  montelukast (SINGULAIR) 10 MG tablet Take 10 mg by mouth daily. 10/10/20  Yes [provider]  Multiple Vitamin (MULTIVITAMIN) tablet Take 1 tablet by mouth daily.   Yes [provider]  Omega-3 Fatty Acids (FISH OIL) 1000 MG CAPS Take 1 capsule by mouth daily.   Yes [provider]  XIIDRA 5 % SOLN Place 1 drop into both eyes 2 (two) times daily. 09/01/20  Yes [provider]  Azelastine-Fluticasone 137-50 MCG/ACT SUSP Place 1 spray into both nostrils 2 (two) times daily. 10/13/20   [provider]    Current Outpatient Medications  Medication Sig Dispense Refill   calcium carbonate (OS-CAL) 600 MG TABS Take 600 mg by mouth daily.     COLLAGEN  PO Take by mouth. Powder daily     esomeprazole (NEXIUM) 40 MG capsule TAKE ONE (1) CAPSULE BY MOUTH 2 TIMES DAILY BEFORE A MEAL 60 capsule 11   montelukast (SINGULAIR) 10 MG tablet Take 10 mg by mouth daily.     Multiple Vitamin (MULTIVITAMIN) tablet Take 1 tablet by mouth daily.     Omega-3 Fatty Acids (FISH OIL) 1000 MG CAPS Take 1 capsule by mouth daily.     XIIDRA 5 % SOLN Place 1 drop into both eyes 2 (two) times daily.     Azelastine-Fluticasone 137-50 MCG/ACT SUSP Place 1 spray into both nostrils 2 (two) times daily.     Current Facility-Administered Medications  Medication Dose Route Frequency Provider Last Rate Last Admin   0.9 %  sodium chloride infusion  500 mL Intravenous Once Ladene Artist, MD        Allergies as of 06/19/2021   (No Known Allergies)    Family History  Problem Relation Age of Onset   Alzheimer's disease Father    Ovarian cancer Maternal Grandmother    Heart disease Maternal Grandmother        and MGF   Stomach cancer Maternal Grandfather    Diabetes Other        Great Uncle   Colon cancer Other        Great Grandmother   Colon polyps  Neg Hx    Esophageal cancer Neg Hx    Rectal cancer Neg Hx     Social History   Socioeconomic History   Marital status: Married    Spouse name: Not on file   Number of children: 2   Years of education: Not on file   Highest education level: Not on file  Occupational History   Occupation: TEACHER    Employer: [email or web address]    Comment: Assistant Principle  Tobacco Use   Smoking status: Never   Smokeless tobacco: Never  Vaping Use   Vaping Use: Never used  Substance and Sexual Activity   Alcohol use: Yes    Comment: 1-2 drinks per day   Drug use: No   Sexual activity: Not on file  Other Topics Concern   Not on file  Social History Narrative   Works at [employer] since 2013, [place]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     Social Determinants of Health   Financial Resource Strain: Not on file  Food Insecurity:  Not on file  Transportation Needs: Not on file  Physical Activity: Not on file  Stress: Not on file  Social Connections: Not on file  Intimate Partner Violence: Not on file    Review of Systems:  All systems reviewed an negative except where noted in HPI.  Gen: Denies any fever, chills, sweats, anorexia, fatigue, weakness, malaise, weight loss, and sleep disorder CV: Denies chest pain, angina, palpitations, syncope, orthopnea, PND, peripheral edema, and claudication. Resp: Denies dyspnea at rest, dyspnea with exercise, cough, sputum, wheezing, coughing up blood, and pleurisy. GI: Denies vomiting blood, jaundice, and fecal incontinence.   Denies dysphagia or odynophagia. GU : Denies urinary burning, blood in urine, urinary frequency, urinary hesitancy, nocturnal urination, and urinary incontinence. MS: Denies joint pain, limitation of movement, and swelling, stiffness, low back pain, extremity pain. Denies muscle weakness, cramps, atrophy.  Derm: Denies rash, itching, dry skin, hives, moles, warts, or unhealing ulcers.  Psych: Denies depression, anxiety, memory loss, suicidal ideation, hallucinations, paranoia, and confusion. Heme: Denies bruising, bleeding, and enlarged lymph nodes. Neuro:  Denies any headaches, dizziness, paresthesias. Endo:  Denies any problems with DM, thyroid, adrenal function.   Physical Exam: General:  Alert, well-developed, in NAD Head:  Normocephalic and atraumatic. Eyes:  Sclera clear, no icterus.   Conjunctiva pink. Ears:  Normal auditory acuity. Mouth:  No deformity or lesions.  Neck:  Supple; no masses . Lungs:  Clear throughout to auscultation.   No wheezes, crackles, or rhonchi. No acute distress. Heart:  Regular rate and rhythm; no murmurs. Abdomen:  Soft, nondistended, nontender. No masses, hepatomegaly. No obvious masses.  Normal bowel .    Rectal:  Deferred   Msk:  Symmetrical without gross deformities.. Pulses:  Normal pulses  noted. Extremities:  Without edema. Neurologic:  Alert and  oriented x4;  grossly normal neurologically. Skin:  Intact without significant lesions or rashes. Cervical Nodes:  No significant cervical adenopathy. Psych:  Alert and cooperative. Normal mood and affect.   Impression / Plan:   CRC screening, average risk for colonoscopy.   This patient is appropriate for endoscopic procedures in the ambulatory setting.    Pricilla Riffle. Fuller Plan  06/19/2021, 9:08 AM

## 2021-06-21 ENCOUNTER — Telehealth: Payer: Self-pay

## 2021-06-21 NOTE — Telephone Encounter (Signed)
Attempted to reach patient for post-procedure f/u call. No answer. Left message for her to please not hesitate to call us if she has any questions or concerns regarding her care.

## 2021-06-21 NOTE — Telephone Encounter (Signed)
Attempted f/u call. No answer, left VM. 

## 2021-06-30 ENCOUNTER — Encounter: Payer: Self-pay | Admitting: Gastroenterology

## 2021-08-23 ENCOUNTER — Other Ambulatory Visit: Payer: Self-pay | Admitting: Orthopedic Surgery

## 2021-08-23 DIAGNOSIS — M2351 Chronic instability of knee, right knee: Secondary | ICD-10-CM

## 2021-08-23 DIAGNOSIS — G8929 Other chronic pain: Secondary | ICD-10-CM

## 2021-08-23 DIAGNOSIS — M25561 Pain in right knee: Secondary | ICD-10-CM

## 2021-08-23 DIAGNOSIS — M2391 Unspecified internal derangement of right knee: Secondary | ICD-10-CM

## 2021-09-06 ENCOUNTER — Ambulatory Visit: Admission: RE | Admit: 2021-09-06 | Payer: BC Managed Care – PPO | Source: Ambulatory Visit

## 2021-09-10 ENCOUNTER — Ambulatory Visit: Payer: BC Managed Care – PPO

## 2021-09-11 ENCOUNTER — Ambulatory Visit
Admission: RE | Admit: 2021-09-11 | Discharge: 2021-09-11 | Disposition: A | Discharge status: Home / Self Care | Payer: BC Managed Care – PPO | Source: Ambulatory Visit | Attending: Orthopedic Surgery | Admitting: Orthopedic Surgery

## 2021-09-11 ENCOUNTER — Other Ambulatory Visit: Payer: Self-pay

## 2021-09-11 DIAGNOSIS — G8929 Other chronic pain: Secondary | ICD-10-CM | POA: Insufficient documentation

## 2021-09-11 DIAGNOSIS — M2351 Chronic instability of knee, right knee: Secondary | ICD-10-CM | POA: Diagnosis present

## 2021-09-11 DIAGNOSIS — M2391 Unspecified internal derangement of right knee: Secondary | ICD-10-CM | POA: Diagnosis present

## 2021-09-11 DIAGNOSIS — M25561 Pain in right knee: Secondary | ICD-10-CM | POA: Insufficient documentation

## 2021-09-21 HISTORY — PX: KNEE SURGERY: SHX244

## 2021-10-11 ENCOUNTER — Ambulatory Visit: Payer: BC Managed Care – PPO | Admitting: Nurse Practitioner

## 2021-10-25 ENCOUNTER — Telehealth (INDEPENDENT_AMBULATORY_CARE_PROVIDER_SITE_OTHER): Payer: BC Managed Care – PPO | Admitting: Family Medicine

## 2021-10-25 ENCOUNTER — Encounter: Payer: Self-pay | Admitting: Family Medicine

## 2021-10-25 ENCOUNTER — Telehealth: Payer: Self-pay

## 2021-10-25 VITALS — Temp 93.9°F | Ht 67.0 in | Wt 136.4 lb

## 2021-10-25 DIAGNOSIS — R051 Acute cough: Secondary | ICD-10-CM

## 2021-10-25 DIAGNOSIS — J189 Pneumonia, unspecified organism: Secondary | ICD-10-CM

## 2021-10-25 DIAGNOSIS — R0981 Nasal congestion: Secondary | ICD-10-CM | POA: Diagnosis not present

## 2021-10-25 MED ORDER — PREDNISONE 20 MG PO TABS: ORAL_TABLET | ORAL | 0 refills | Status: DC

## 2021-10-25 MED ORDER — DOXYCYCLINE HYCLATE 100 MG PO TABS: 100.0000 mg | ORAL_TABLET | Freq: Two times a day (BID) | ORAL | 0 refills | Status: DC

## 2021-10-25 NOTE — Progress Notes (Signed)
Tanya Cobb T. Tanya Crandell, MD [facility] [name], [address] Phone: [phone]   FAX: [place] - 56 y.o. female   MRN [id number]   Date of Birth: May 25, 1966  Visit Date: 10/25/2021   PCP: Tanya Ghent, MD   Referred by: Tanya Ghent, MD  Virtual Visit via Video Note:  I connected with  Tanya Cobb on 10/25/2021  9:00 AM EST by a video enabled telemedicine application and verified that I am speaking with the correct person using two identifiers.   Location patient: home computer, tablet, or smartphone Location provider: work or home office Consent: Verbal consent directly obtained from Tanya Cobb. Persons participating in the virtual visit: patient, provider  I discussed the limitations of evaluation and management by telemedicine and the availability of in person appointments. The patient expressed understanding and agreed to proceed.  Interactive audio and video telecommunications were attempted between this provider and patient, however failed, due to patient having technical difficulties OR patient did not have access to video capability.  We continued and completed visit with audio only.    Chief Complaint  Patient presents with   Cough    X 18 days. Covid test negative x 3   Nasal Congestion   Fatigue    History of Present Illness:  Sick x almost 3 weeks. She has had 3 different COVID test which were negative.  She has generalized fatigue, exhaustion, and a severe cough that has been worsening over the last week. Feels as if her symptoms are all in her lungs. Recently, she has been waking up sweating, but she has not had a fever during the daytime.  Throughout, she denies any arthralgia, myalgia, sore throat, earache.  No GI symptoms.  She has never been a smoker, she does not have any pulmonary disease   Review of Systems as above: See  pertinent positives and pertinent negatives per HPI No acute distress verbally   Observations/Objective/Exam:  An attempt was made to discern vital signs over the phone and per patient if applicable and possible.   General:    Alert, Oriented, appears well and in no acute distress  Pulmonary:     On inspection no signs of respiratory distress.  Psych / Neurological:     Pleasant and cooperative.  Assessment and Plan:    ICD-10-CM   1. Atypical pneumonia  J18.9     2. Acute cough  R05.1     3. Nasal congestion  R09.81      Total encounter time: 20 minutes. This includes total time spent on the day of encounter.  This includes chart review, conversation, and reviewing non-primary care patient.  With acute worsening after a prolonged illness, concern for potential secondary pneumonia.  Treat as such with doxycycline.  She is having some shortness of breath, so I am also going to give her some prednisone.  I discussed the assessment and treatment plan with the patient. The patient was provided an opportunity to ask questions and all were answered. The patient agreed with the plan and demonstrated an understanding of the instructions.   The patient was advised to call back or seek an in-person evaluation if the symptoms worsen or if the condition fails to improve as anticipated.  Follow-up: prn unless noted otherwise below No follow-ups on file.  Meds ordered this encounter  Medications   doxycycline (VIBRA-TABS) 100  MG tablet    Sig: Take 1 tablet (100 mg total) by mouth 2 (two) times daily.    Dispense:  20 tablet    Refill:  0   predniSONE (DELTASONE) 20 MG tablet    Sig: 2 tabs po for 4 days, then 1 tab po for 4 days    Dispense:  12 tablet    Refill:  0   No orders of the defined types were placed in this encounter.   Signed,  Tanya Deed. Venezia Sargeant, MD

## 2021-10-25 NOTE — Telephone Encounter (Signed)
[place] Night - Client TELEPHONE ADVICE RECORD [facility] Patient Name: Tanya Cobb Gender: Female DOB: 05/04/1966 Age: 56 Y 39 M 20 D Return Phone Number: [phone] (Primary) Address[place]  [address] [address] [facility] [place] Night - Client Client Site [place] Provider AA - PHYSICIAN, NOT LISTED- MD Contact Type Call Who Is Calling Patient / Member / Family / Caregiver Call Type Triage / Clinical Relationship To Patient Self Return Phone Number [phone] (Primary) Chief Complaint BREATHING - shortness of breath or sounds breathless Reason for Call Symptomatic / Request for [place] has had chest and head congestion, thought getting better, got worse and now has complete exhaustion. Caller states started w/ cough, then went to sinus congestion. Dr. Elsie Stain is provider. Caller feels something in upper respiratory, irritated. Caller does not feel heavy, feels like cannot get enough air. Translation No Nurse Assessment Nurse: Leilani Merl, RN, Heather Date/Time (Eastern Time): 10/25/2021 8:01:36 AM Confirm and document reason for call. If symptomatic, describe symptoms. ---Caller has had chest and head congestion, thought getting better, got worse and now has complete exhaustion. Caller states started w/ cough, then went to sinus congestion. Caller feels something in upper respiratory, irritated. Caller does not feel heavy, feels like cannot get enough air. Does the patient have any new or worsening symptoms? ---Yes Will a triage be completed? ---Yes Related visit to physician within the last 2 weeks? ---No Does the PT have any chronic conditions? (i.e. diabetes, asthma, this includes High risk factors for pregnancy, etc.) ---Yes List chronic conditions. ---reflux Is this a behavioral health or substance abuse call?  ---No Guidelines Guideline Title Affirmed Question Affirmed Notes Nurse Date/Time ([place] Time) COVID-19 - Diagnosed or Suspected MILD difficulty breathing (e.g., minimal/no SOB Standifer, RN, Nira Conn 10/25/2021 8:02:53 AM PLEASE NOTE: All timestamps contained within this report are represented as [place] Standard Time. CONFIDENTIALTY NOTICE: This fax transmission is intended only for the addressee. It contains information that is legally privileged, confidential or otherwise protected from use or disclosure. If you are not the intended recipient, you are strictly prohibited from reviewing, disclosing, copying using or disseminating any of this information or taking any action in reliance on or regarding this information. If you have received this fax in error, please notify us immediately by telephone so that we can arrange for its return to [place]. Phone: [phone], Toll-Free: [phone], Fax: [phone] Page: 2 of 2 Call Id: [id number] Guidelines Guideline Title Affirmed Question Affirmed Notes Nurse Date/Time Eilene Ghazi Time) at rest, SOB with walking, pulse <100) Disp. Time Eilene Ghazi Time) Disposition Final User 10/25/2021 7:59:04 AM Send to Urgent Queue Iver Nestle 10/25/2021 8:05:29 AM See HCP within 4 Hours (or PCP triage) Yes Standifer, RN, Nira Conn Caller Disagree/Comply Comply Caller Understands Yes PreDisposition Call Doctor Care Advice Given Per Guideline SEE HCP (OR PCP TRIAGE) WITHIN 4 HOURS: * IF OFFICE WILL BE OPEN: You need to be seen within the next 3 or 4 hours. Call your doctor (or NP/PA) now or as soon as the office opens. CARE ADVICE given per COVID-19 - DIAGNOSED OR SUSPECTED (Adult) guideline. CALL BACK IF: * You become worse Referrals REFERRED TO PCP OFFIC

## 2021-10-25 NOTE — Telephone Encounter (Signed)
Pt already started VV with Dr Lorelei Pont 10/25/21 at 9 AM. Sending note to Dr Lorelei Pont.

## 2021-10-27 ENCOUNTER — Ambulatory Visit
Admission: EM | Admit: 2021-10-27 | Discharge: 2021-10-27 | Disposition: A | Discharge status: Home / Self Care | Payer: BC Managed Care – PPO | Attending: Physician Assistant | Admitting: Physician Assistant

## 2021-10-27 ENCOUNTER — Encounter: Payer: Self-pay | Admitting: Emergency Medicine

## 2021-10-27 ENCOUNTER — Ambulatory Visit (INDEPENDENT_AMBULATORY_CARE_PROVIDER_SITE_OTHER): Payer: BC Managed Care – PPO

## 2021-10-27 ENCOUNTER — Telehealth: Payer: Self-pay | Admitting: Family Medicine

## 2021-10-27 ENCOUNTER — Other Ambulatory Visit: Payer: Self-pay

## 2021-10-27 ENCOUNTER — Telehealth: Payer: Self-pay

## 2021-10-27 DIAGNOSIS — R059 Cough, unspecified: Secondary | ICD-10-CM | POA: Diagnosis not present

## 2021-10-27 DIAGNOSIS — R051 Acute cough: Secondary | ICD-10-CM | POA: Diagnosis not present

## 2021-10-27 DIAGNOSIS — R5383 Other fatigue: Secondary | ICD-10-CM | POA: Diagnosis not present

## 2021-10-27 DIAGNOSIS — J209 Acute bronchitis, unspecified: Secondary | ICD-10-CM | POA: Diagnosis not present

## 2021-10-27 MED ORDER — CHERATUSSIN AC 100-10 MG/5ML PO SOLN: 10.0000 mL | Freq: Three times a day (TID) | ORAL | 0 refills | Status: DC | PRN

## 2021-10-27 NOTE — Discharge Instructions (Addendum)
-  Your chest x-ray is normal. - Your vital signs all are great.  You do not have a fever and your oxygen is under percent.  Also, your lungs are completely clear when I listen to you. - I suspect you probably had the flu and then bronchitis which as we discussed is a chest cold. - Continue to medications that you have been prescribed by your PCP.  Increase rest and fluids. - I have sent cough medication to the pharmacy. - I expect you should feel better over the next week.  However, if you do not, please follow back up with your PCP. - Go to ER for worsening of your shortness of breath, chest tightness or if you run a fever.

## 2021-10-27 NOTE — Telephone Encounter (Signed)
Note reviewed.  She may need PCP follow-up if not improving.

## 2021-10-27 NOTE — Telephone Encounter (Signed)
Tanya Cobb called in and  stated that she saw Dr. Lorelei Pont on Wednesday and she was prescribed medication and its not working and wanted to know if she can get something stronger due to she is still having congestion, tightness in the chest , and extreme exhaustion.   The pharmacy - [facility] [address]

## 2021-10-27 NOTE — ED Provider Notes (Signed)
[facility]    CSN: [id number] Arrival date & time: 10/27/21  0936      History   Chief Complaint Chief Complaint  Patient presents with   Pneumonia   Cough    HPI Tanya Cobb is a 56 y.o. female presenting for 3-week history of cough and congestion.  Patient reports the cough is occasionally productive.  She reports chest tightness.  Shortness of breath when she lays down.  Patient says symptoms seem to have gotten worse.  She had a video visit with her PCP 2 days ago and was prescribed doxycycline and prednisone.  Patient says she does not feel any better after starting the medicines.  She has taken Alka-Seltzer over-the-counter as well.  She denies any associated fevers.  Admits to fatigue and feeling exhausted.  Patient reports exposure to the flu before symptoms started.  She was never tested for the flu.  No known COVID exposure.  No other complaints.  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      Home Medications    Prior to Admission medications   Medication Sig Start Date End Date  Taking? Authorizing Provider  guaiFENesin-codeine (CHERATUSSIN AC) 100-10 MG/5ML syrup Take 10 mLs by mouth 3 (three) times daily as needed for cough. 10/27/21  Yes Danton Clap, PA-C  Azelastine-Fluticasone 137-50 MCG/ACT SUSP Place 1 spray into both nostrils 2 (two) times daily. 10/13/20   [provider]  calcium carbonate (OS-CAL) 600 MG TABS Take 600 mg by mouth daily.    [provider]  COLLAGEN PO Take by mouth. Powder daily    [provider]  doxycycline (VIBRA-TABS) 100 MG tablet Take 1 tablet (100 mg total) by mouth 2 (two) times daily. 10/25/21   Copland, Frederico Hamman, MD  esomeprazole (NEXIUM) 40 MG capsule TAKE ONE (1) CAPSULE BY MOUTH 2 TIMES DAILY BEFORE A MEAL 12/27/20   Ladene Artist, MD  Misc Natural Products (CANDICIDAL) CAPS Take by mouth.    [provider]  montelukast (SINGULAIR) 10 MG tablet Take 10 mg by mouth daily. 10/10/20   [provider]  Multiple Vitamin (MULTIVITAMIN) tablet Take 1 tablet by mouth daily.    [provider]  Omega-3 Fatty Acids (FISH OIL) 1000 MG CAPS Take 1 capsule by mouth daily.    [provider]  predniSONE (DELTASONE) 20 MG tablet 2 tabs po for 4 days, then 1 tab po for 4 days 10/25/21   Copland, [place],  MD  XIIDRA 5 % SOLN Place 1 drop into both eyes 2 (two) times daily. 09/01/20   [provider]    Family History Family History  Problem Relation Age of Onset   Alzheimer's disease Father    Ovarian cancer Maternal Grandmother    Heart disease Maternal Grandmother        and MGF   Stomach cancer Maternal Grandfather    Diabetes Other        Great Uncle   Colon cancer Other        Great Grandmother   Colon polyps Neg Hx    Esophageal cancer Neg Hx    Rectal cancer Neg Hx     Social History Social History   Tobacco Use   Smoking status: Never   Smokeless tobacco: Never  Vaping Use   Vaping Use: Never used  Substance Use Topics   Alcohol use: Yes     Comment: 1-2 drinks per day   Drug use: No     Allergies   Patient has no known allergies.   Review of Systems Review of Systems  Constitutional:  Positive for fatigue. Negative for chills, diaphoresis and fever.  HENT:  Positive for congestion. Negative for ear pain, rhinorrhea, sinus pressure, sinus pain and sore throat.   Respiratory:  Positive for cough, chest tightness and shortness of breath. Negative for wheezing.   Cardiovascular:  Negative for chest pain.  Gastrointestinal:  Negative for abdominal pain, nausea and vomiting.  Musculoskeletal:  Negative for arthralgias and myalgias.  Skin:  Negative for rash.  Neurological:  Negative for weakness and headaches.  Hematological:  Negative for adenopathy.    Physical Exam Triage Vital Signs ED Triage Vitals  Enc Vitals Group     BP 10/27/21 0959 (!) 134/97     Pulse Rate 10/27/21 0959 65     Resp 10/27/21 0959 14     Temp 10/27/21 0959 98 F (36.7 C)     Temp Source 10/27/21 0959 Oral     SpO2 10/27/21 0959 100 %     Weight 10/27/21 0957 137 lb (62.1 kg)     Height 10/27/21 0957 5\' 7"  (1.702 m)     Head Circumference --      Peak Flow --      Pain Score 10/27/21 0957 7     Pain Loc --      Pain Edu? --      Excl. in [place]? --    No data found.  Updated Vital Signs BP (!) 134/97 (BP Location: Left Arm)    Pulse 65    Temp 98 F (36.7 C) (Oral)    Resp 14    Ht 5\' 7"  (1.702 m)    Wt 137 lb (62.1 kg)    SpO2 100%    BMI 21.46 kg/m      Physical Exam Vitals and nursing note reviewed.  Constitutional:      General: She is not in acute distress.    Appearance: Normal appearance. She is not ill-appearing or toxic-appearing.  HENT:     Head: Normocephalic and atraumatic.     Nose: Congestion present.     Mouth/Throat:     Mouth: Mucous membranes are moist.     Pharynx: Oropharynx is clear.  Eyes:     General: No scleral icterus.       Right eye: No discharge.        Left eye: No discharge.  Conjunctiva/sclera: Conjunctivae normal.  Cardiovascular:     Rate and Rhythm: Normal rate and regular rhythm.     Heart sounds: Normal heart sounds.  Pulmonary:     Effort: Pulmonary effort is normal. No respiratory distress.     Breath sounds: Normal breath sounds. No wheezing, rhonchi or rales.  Musculoskeletal:     Cervical back: Neck supple.  Skin:    General: Skin is dry.  Neurological:     General: No focal deficit present.     Mental Status: She is alert. Mental status is at baseline.     Motor: No weakness.     Gait: Gait normal.  Psychiatric:        Mood and Affect: Mood normal.        Behavior: Behavior normal.        Thought Content: Thought content normal.     UC Treatments / Results  Labs (all labs ordered are listed, but only abnormal results are displayed) Labs Reviewed - No data to display  EKG   Radiology DG Chest 2 View  Result Date: 10/27/2021 CLINICAL DATA:  Cough for 3 weeks EXAM: CHEST - 2 VIEW COMPARISON:  04/12/2017 FINDINGS: The heart size and mediastinal contours are within normal limits. Both lungs are clear. The visualized skeletal structures are unremarkable. IMPRESSION: No acute abnormality of the lungs. Electronically Signed   By: Delanna Ahmadi M.D.   On: 10/27/2021 10:42    Procedures Procedures (including critical care time)  Medications Ordered in UC Medications - No data to display  Initial Impression / Assessment and Plan / UC Course  I have reviewed the triage vital signs and the nursing notes.  Pertinent labs & imaging results that were available during my care of the patient were reviewed by me and considered in my medical decision making (see chart for details).   56 year old female presenting for 3-week history of cough congestion.  Cough is occasionally productive.  Other associated symptoms are chest tightness and shortness of breath when she lays down.  No associated fever.  Currently taking doxycycline and prednisone as well  as Alka-Seltzer.  Vitals all stable.  She is afebrile.  Oxygen is 100%.  Patient is overall well-appearing and dressed very nicely.  She has mild nasal congestion on exam.  Chest is clear to auscultation heart regular rate and rhythm.  Chest x-ray performed today.  X-ray reviewed by me.  Overread shows no acute abnormality of lungs.  Discussed with patient.  Advised her she does not have pneumonia.  She may have bronchitis which I explained there is a chest cold, likely caused by the flu since she was exposed to the flu.  Advised her to go ahead and finish out the medicines prescribed by her PCP.  Sent Cheratussin to pharmacy to help with cough.  Reviewed controlled substance database before sent.  Advised her to increase rest and fluids and follow-up with her PCP if she is not feeling better after the next 1 week.  ER precautions also reviewed.   Final Clinical Impressions(s) / UC Diagnoses   Final diagnoses:  Acute bronchitis, unspecified organism  Acute cough  Other fatigue     Discharge Instructions      -Your chest x-ray is normal. - Your vital signs all are great.  You do not have a fever and your oxygen is under percent.  Also, your lungs are completely clear when I listen to you. - I suspect you probably had the flu and then  bronchitis which as we discussed is a chest cold. - Continue to medications that you have been prescribed by your PCP.  Increase rest and fluids. - I have sent cough medication to the pharmacy. - I expect you should feel better over the next week.  However, if you do not, please follow back up with your PCP. - Go to ER for worsening of your shortness of breath, chest tightness or if you run a fever.     ED Prescriptions     Medication Sig Dispense Auth. Provider   guaiFENesin-codeine (CHERATUSSIN AC) 100-10 MG/5ML syrup Take 10 mLs by mouth 3 (three) times daily as needed for cough. 118 mL Danton Clap, PA-C      I have reviewed the PDMP during  this encounter.   Danton Clap, PA-C 10/27/21 1104

## 2021-10-27 NOTE — Telephone Encounter (Signed)
[place] Night - Client Nonclinical Telephone Record  [facility] Client [place] Night - Client Client Site [place] - Night Provider Owens Loffler - MD Contact Type Call Who Is Calling Patient / Member / Family / Caregiver Caller Name Harlan Phone Number [phone] Patient Name Tanya Cobb Patient DOB [birth year] Call Type Message Only Information Provided Reason for Call Medication Question / Request Initial Comment Caller states she was put on steroids and a antibiotic on Wednesday for Pneumonia and isnt feeling better. She wants a stronger antibiotic Disp. Time Disposition Final User 10/27/2021 7:05:51 AM General Information Provided Yes Bryson Corona Call Closed By: Bryson Corona Transaction Date/Time: 10/27/2021 7:03:00 AM (ET

## 2021-10-27 NOTE — ED Triage Notes (Signed)
Patient states that she was diagnosed with Pneumonia on Wed at her PCP.  Patient states that she is currently on Doxycycline and Prednisone.  Patient reports that she is not feeling better and is very fatigued.  Patient denies recent fevers.

## 2021-10-27 NOTE — Telephone Encounter (Signed)
I spoke with Tanya Cobb and she has had 2 days of abx and does not feel any better; I advised Tanya Cobb that can take 2 - 3 days for abx to get into system to see improvement. Tanya Cobb said she is not having CP but Tanya Cobb does have SOB when laying or upon any exertion. Tanya Cobb has both productive cough with yellow phlegm and a dry cough at times. Tanya Cobb does not think she has fever or wheezing. Per Dr Lorelei Pont instruction on VV on 10/25/21 Tanya Cobb wants to have in person visit to have someone listen to lungs and get CXR if needed. No available appts with [facility] and Tanya Cobb is going to [facility] now. Sending note to Dr Lorelei Pont and Dr Damita Dunnings as PCP as Juluis Rainier.

## 2021-10-29 NOTE — Telephone Encounter (Signed)
Please get update on patent.  Thanks.

## 2021-10-30 NOTE — Telephone Encounter (Signed)
Noted. Thanks.

## 2021-10-30 NOTE — Telephone Encounter (Signed)
Patient is doing much better today after the weekend. She does have about a week or so left on the abx. Advised to call back if no better or gets worse after finishing abx. Patient verbalized understanding and will call back if needed.

## 2021-12-28 ENCOUNTER — Encounter: Payer: Self-pay | Admitting: Gastroenterology

## 2021-12-28 ENCOUNTER — Ambulatory Visit: Payer: BC Managed Care – PPO | Admitting: Gastroenterology

## 2021-12-28 VITALS — BP 110/60 | HR 74 | Ht 67.0 in | Wt 140.6 lb

## 2021-12-28 DIAGNOSIS — Z86010 Personal history of colonic polyps: Secondary | ICD-10-CM

## 2021-12-28 DIAGNOSIS — K219 Gastro-esophageal reflux disease without esophagitis: Secondary | ICD-10-CM

## 2021-12-28 MED ORDER — ESOMEPRAZOLE MAGNESIUM 40 MG PO CPDR: DELAYED_RELEASE_CAPSULE | ORAL | 11 refills | Status: DC

## 2021-12-28 NOTE — Patient Instructions (Signed)
We have sent the following medications to your pharmacy for you to pick up at your convenience: Nexium.  ? ?The [facility] providers would like to encourage you to use [facility] to communicate with providers for non-urgent requests or questions.  Due to long hold times on the telephone, sending your provider a message by [facility] may be a faster and more efficient way to get a response.  Please allow 48 business hours for a response.  Please remember that this is for non-urgent requests.  ? ?Thank you for choosing me and [facility]. ? ?Malcolm T. Dagoberto Ligas., MD., Marval Regal ? ? ?

## 2021-12-28 NOTE — Progress Notes (Signed)
? ? ?  History of Present Illness: This is a 56 year old female returning for follow-up of GERD and LPR.  She relates her reflux symptoms are under very good control.  She has concerns about long-term risk of esophageal cancer. ? ?Current Medications, Allergies, Past Medical History, Past Surgical History, Family History and Social History were reviewed in [employer] record. ? ? ?Physical Exam: ?General: Well developed, well nourished, no acute distress ?Head: Normocephalic and atraumatic ?Eyes: Sclerae anicteric, EOMI ?Ears: Normal auditory acuity ?Mouth: Not examined, mask on during Covid-19 pandemic ?Lungs: Clear throughout to auscultation ?Heart: Regular rate and rhythm; no murmurs, rubs or bruits ?Abdomen: Soft, non tender and non distended. No masses, hepatosplenomegaly or hernias noted. Normal Bowel sounds ?Rectal: Not done ?Musculoskeletal: Symmetrical with no gross deformities  ?Pulses:  Normal pulses noted ?Extremities: No clubbing, cyanosis, edema or deformities noted ?Neurological: Alert oriented x 4, grossly nonfocal ?Psychological:  Alert and cooperative. Normal mood and affect ? ? ?Assessment and Recommendations: ? ?GERD with LPR.  She is reassured that her EGDs have not shown evidence of Barrett's esophagus including her most recent EGD in September 2019.  Follow antireflux measures.  Continue Nexium 40 mg p.o. twice daily.  Consider repeat EGD at time of surveillance colonoscopy.  REV in 1 year. ?Personal history of adenomatous colon polyps.  She is reassured that she had 1 small adenomatous colon polyp on her colonoscopy in August 2022.  A 7-year interval surveillance colonoscopy is recommended in August 2029.  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

## 2022-01-30 ENCOUNTER — Other Ambulatory Visit: Payer: Self-pay | Admitting: Obstetrics and Gynecology

## 2022-01-30 DIAGNOSIS — Z853 Personal history of malignant neoplasm of breast: Secondary | ICD-10-CM

## 2022-01-30 DIAGNOSIS — Z1231 Encounter for screening mammogram for malignant neoplasm of breast: Secondary | ICD-10-CM

## 2022-02-28 ENCOUNTER — Ambulatory Visit: Payer: BC Managed Care – PPO | Admitting: Nurse Practitioner

## 2022-02-28 ENCOUNTER — Encounter: Payer: Self-pay | Admitting: Nurse Practitioner

## 2022-02-28 VITALS — BP 110/64 | HR 78 | Temp 97.6°F | Resp 14 | Wt 146.0 lb

## 2022-02-28 DIAGNOSIS — B001 Herpesviral vesicular dermatitis: Secondary | ICD-10-CM

## 2022-02-28 DIAGNOSIS — K130 Diseases of lips: Secondary | ICD-10-CM | POA: Diagnosis not present

## 2022-02-28 MED ORDER — VALACYCLOVIR HCL 1 G PO TABS: 1000.0000 mg | ORAL_TABLET | Freq: Two times a day (BID) | ORAL | 0 refills | Status: AC

## 2022-02-28 NOTE — Progress Notes (Signed)
? ?Acute Office Visit ? ?Subjective:  ? ?  ?Patient ID: Tanya Cobb, female    DOB: 1965-11-10, 56 y.o.   MRN: [id number] ? ?Chief Complaint  ?Patient presents with  ? Oral Swelling  ?  Lip swelling and burning sensation since returning from [place], started about 2 to 3 days after returning and has been present for 12 days now. Maybe had some blistering but not sure. No fever. Had such severe lip pain the first week that it would wake patient up.  ? ? ?HPI ?Patient is in today for lip problem ? ?Symptoms started on 02/19/2022. This was after she had returned form [place] for 7-day vacation.  Patient states that she did wear hat, sunscreen, lip cream that did have SPF in it. ?States that it started tingling then 3 days after she noticed pain that would wake her up at night. Ibuprofen did help.  She has tried many modalities inclusive of Aquaphor, hydrocortisone cream, lipstick, lip gloss, and Vaseline with minimal relief.  States her husband is experiencing the same symptoms but his seems to be improving slightly patient has no history of cold sores. ? ? ?Had her first shingrix vaccine. Had shingles approx 5 years ago, internal per patient report with no rash eruption ? ? ?Review of Systems  ?Constitutional:  Negative for chills and fever.  ?HENT:  Positive for sore throat (5 days ago, improving).   ?     Burning/tingling sensation to lips  ?Skin:   ?     Cracking of lips.  ?Neurological:  Positive for tingling.  ? ? ?   ?Objective:  ?  ?BP 110/64   Pulse 78   Temp 97.6 ?F (36.4 ?C)   Resp 14   Wt 146 lb (66.2 kg)   SpO2 97%   BMI 22.87 kg/m?  ? ? ?Physical Exam ?Vitals and nursing note reviewed.  ?Constitutional:   ?   Appearance: Normal appearance.  ?HENT:  ?   Right Ear: Tympanic membrane, ear canal and external ear normal.  ?   Left Ear: Tympanic membrane, ear canal and external ear normal.  ?   Mouth/Throat:  ?   Mouth: Mucous membranes are moist.  ?   Pharynx: Oropharynx is clear. No posterior  oropharyngeal erythema.  ?   Comments: Lips did have scattered patches of dry skin.  No cheilitis noted in the corners of the mouth.  Patient does have a darkened papule to the upper inner lateral lip that has been there for many years per patient report ?Cardiovascular:  ?   Rate and Rhythm: Normal rate and regular rhythm.  ?Pulmonary:  ?   Effort: Pulmonary effort is normal.  ?   Breath sounds: Normal breath sounds.  ?Lymphadenopathy:  ?   Cervical: No cervical adenopathy.  ?Neurological:  ?   Mental Status: She is alert.  ? ? ?No results found for any visits on 02/28/22. ? ? ?   ?Assessment & Plan:  ? ?Problem List Items Addressed This Visit   ? ?  ? Digestive  ? Cold sore - Primary  ?  No apparent cold sore on exam today.  Patient signs and symptoms reminiscent of such we will treat with valacyclovir 1 g twice daily for 7 days.  Patient is in agreements with this plan.  If this does not help we can consider using over-the-counter antifungal creams for a cheilitis ? ?  ?  ? Relevant Medications  ? valACYclovir (VALTREX) 1000 MG tablet  ?  Lip pain  ?  Treat for herpetic virus.  Patient continue over-the-counter treatments if beneficial inclusive of ibuprofen and lip balm's ? ?  ?  ? Relevant Medications  ? valACYclovir (VALTREX) 1000 MG tablet  ? ? ?Meds ordered this encounter  ?Medications  ? valACYclovir (VALTREX) 1000 MG tablet  ?  Sig: Take 1 tablet (1,000 mg total) by mouth 2 (two) times daily for 7 days.  ?  Dispense:  14 tablet  ?  Refill:  0  ?  Order Specific Question:   Supervising Provider  ?  Answer:   Loura Pardon A [[id number]]  ? ? ?No follow-ups on file. ? ?Romilda Garret, NP ? ? ?

## 2022-02-28 NOTE — Assessment & Plan Note (Signed)
Treat for herpetic virus.  Patient continue over-the-counter treatments if beneficial inclusive of ibuprofen and lip balm's ?

## 2022-02-28 NOTE — Patient Instructions (Signed)
Nice to see you today ?Let me know if it does not improve ?Follow up as needed ?

## 2022-02-28 NOTE — Assessment & Plan Note (Signed)
No apparent cold sore on exam today.  Patient signs and symptoms reminiscent of such we will treat with valacyclovir 1 g twice daily for 7 days.  Patient is in agreements with this plan.  If this does not help we can consider using over-the-counter antifungal creams for a cheilitis ?

## 2022-04-04 ENCOUNTER — Ambulatory Visit
Admission: RE | Admit: 2022-04-04 | Discharge: 2022-04-04 | Disposition: A | Discharge status: Home / Self Care | Payer: BC Managed Care – PPO | Source: Ambulatory Visit | Attending: Obstetrics and Gynecology | Admitting: Obstetrics and Gynecology

## 2022-04-04 ENCOUNTER — Other Ambulatory Visit: Payer: Self-pay | Admitting: Obstetrics and Gynecology

## 2022-04-04 DIAGNOSIS — Z1231 Encounter for screening mammogram for malignant neoplasm of breast: Secondary | ICD-10-CM

## 2022-04-04 DIAGNOSIS — Z853 Personal history of malignant neoplasm of breast: Secondary | ICD-10-CM

## 2022-09-12 ENCOUNTER — Telehealth: Payer: Self-pay | Admitting: Family Medicine

## 2022-09-12 MED ORDER — SCOPOLAMINE 1 MG/3DAYS TD PT72: 1.0000 | MEDICATED_PATCH | TRANSDERMAL | 0 refills | Status: DC

## 2022-09-12 NOTE — Telephone Encounter (Signed)
Patient for a cruise on 12.2.23, would like the motion sickness patch, 5 days  Pharmacy:  [id number] [place] [place]: [phone] Fax: [phone]     Needs for husband DShanon Browalso

## 2022-11-08 ENCOUNTER — Ambulatory Visit: Payer: BC Managed Care – PPO | Admitting: Family Medicine

## 2022-11-08 ENCOUNTER — Encounter: Payer: Self-pay | Admitting: Family Medicine

## 2022-11-08 VITALS — BP 100/64 | HR 79 | Temp 98.0°F | Ht 67.0 in | Wt 152.2 lb

## 2022-11-08 DIAGNOSIS — J019 Acute sinusitis, unspecified: Secondary | ICD-10-CM | POA: Diagnosis not present

## 2022-11-08 MED ORDER — AMOXICILLIN 500 MG PO CAPS: 1000.0000 mg | ORAL_CAPSULE | Freq: Two times a day (BID) | ORAL | 0 refills | Status: DC

## 2022-11-08 NOTE — Progress Notes (Signed)
Patient ID: Tanya Cobb, female    DOB: 1965-12-27, 57 y.o.   MRN: [id number]  This visit was conducted in person.  BP 100/64   Pulse 79   Temp 98 F (36.7 C) (Oral)   Ht '5\' 7"'$  (1.702 m)   Wt 152 lb 4 oz (69.1 kg)   SpO2 97%   BMI 23.85 kg/m    CC:  Chief Complaint  Patient presents with   Headache    X 2 weeks   Fatigue   Nasal Congestion   Sinus Drainage   Facial Pressure    Subjective:   HPI: Tanya Cobb is a 57 y.o. female patient of Tonia Ghent, MD presenting on 11/08/2022 for Headache (X 2 weeks), Fatigue, Nasal Congestion, Sinus Drainage, and Facial Pressure  Date of onset: 2 weeks Initial symptoms included  facial pressure, nasal congestion, sinus headache Symptoms progressed to fatigue, greenish nasal discharge, frontal pressure, > right sided maxillary pressure.   No fever,  mild increase in SOB, no wheeze.  No cough.  Sick contacts:  none COVID testing:   none     She has tried to treat with  ibuprofen and nasal steroid spray ( azelastine and fluticasone. Neomed irrigation.     No history of chronic lung disease such as asthma or COPD. Non-smoker.   No antibiotics in last 30 days.     Relevant past medical, surgical, family and social history reviewed and updated as indicated. Interim medical history since our last visit reviewed. Allergies and medications reviewed and updated. Outpatient Medications Prior to Visit  Medication Sig Dispense Refill   Azelastine-Fluticasone 137-50 MCG/ACT SUSP Place 1 spray into both nostrils 2 (two) times daily.     calcium carbonate (OS-CAL) 600 MG TABS Take 600 mg by mouth daily.     COLLAGEN PO Take by mouth. Powder daily     Creatine POWD 1 Scoop by Does not apply route daily.     esomeprazole (NEXIUM) 40 MG capsule TAKE ONE (1) CAPSULE BY MOUTH 2 TIMES DAILY BEFORE A MEAL 60 capsule 11   Misc Natural Products (CANDICIDAL) CAPS Take by mouth.     montelukast (SINGULAIR) 10 MG tablet Take 10 mg  by mouth daily.     Multiple Vitamin (MULTIVITAMIN) tablet Take 1 tablet by mouth daily.     Omega-3 Fatty Acids (FISH OIL) 1000 MG CAPS Take 1 capsule by mouth daily.     scopolamine (TRANSDERM-SCOP) 1 MG/3DAYS Place 1 patch (1.5 mg total) onto the skin every 3 (three) days. 4 patch 0   Turmeric 500 MG CAPS Take by mouth.     Facility-Administered Medications Prior to Visit  Medication Dose Route Frequency Provider Last Rate Last Admin   0.9 %  sodium chloride infusion  500 mL Intravenous Once Ladene Artist, MD         Per HPI unless specifically indicated in ROS section below Review of Systems  Constitutional:  Negative for fatigue and fever.  HENT:  Negative for congestion.   Eyes:  Negative for pain.  Respiratory:  Negative for cough and shortness of breath.   Cardiovascular:  Negative for chest pain, palpitations and leg swelling.  Gastrointestinal:  Negative for abdominal pain.  Genitourinary:  Negative for dysuria and vaginal bleeding.  Musculoskeletal:  Negative for back pain.  Neurological:  Negative for syncope, light-headedness and headaches.  Psychiatric/Behavioral:  Negative for dysphoric mood.    Objective:  BP 100/64   Pulse 79  Temp 98 F (36.7 C) (Oral)   Ht '5\' 7"'$  (1.702 m)   Wt 152 lb 4 oz (69.1 kg)   SpO2 97%   BMI 23.85 kg/m   Wt Readings from Last 3 Encounters:  11/08/22 152 lb 4 oz (69.1 kg)  02/28/22 146 lb (66.2 kg)  12/28/21 140 lb 9.6 oz (63.8 kg)      Physical Exam Constitutional:      General: She is not in acute distress.    Appearance: Normal appearance. She is well-developed. She is not ill-appearing or toxic-appearing.  HENT:     Head: Normocephalic.     Right Ear: Hearing, tympanic membrane, ear canal and external ear normal. Tympanic membrane is not erythematous, retracted or bulging.     Left Ear: Hearing, tympanic membrane, ear canal and external ear normal. Tympanic membrane is not erythematous, retracted or bulging.     Nose:  No mucosal edema or rhinorrhea.     Right Sinus: No maxillary sinus tenderness or frontal sinus tenderness.     Left Sinus: No maxillary sinus tenderness or frontal sinus tenderness.     Mouth/Throat:     Pharynx: Uvula midline.  Eyes:     General: Lids are normal. Lids are everted, no foreign bodies appreciated.     Conjunctiva/sclera: Conjunctivae normal.     Pupils: Pupils are equal, round, and reactive to light.  Neck:     Thyroid: No thyroid mass or thyromegaly.     Vascular: No carotid bruit.     Trachea: Trachea normal.  Cardiovascular:     Rate and Rhythm: Normal rate and regular rhythm.     Pulses: Normal pulses.     Heart sounds: Normal heart sounds, S1 normal and S2 normal. No murmur heard.    No friction rub. No gallop.  Pulmonary:     Effort: Pulmonary effort is normal. No tachypnea or respiratory distress.     Breath sounds: Normal breath sounds. No decreased breath sounds, wheezing, rhonchi or rales.  Abdominal:     General: Bowel sounds are normal.     Palpations: Abdomen is soft.     Tenderness: There is no abdominal tenderness.  Musculoskeletal:     Cervical back: Normal range of motion and neck supple.  Skin:    General: Skin is warm and dry.     Findings: No rash.  Neurological:     Mental Status: She is alert.  Psychiatric:        Mood and Affect: Mood is not anxious or depressed.        Speech: Speech normal.        Behavior: Behavior normal. Behavior is cooperative.        Thought Content: Thought content normal.        Judgment: Judgment normal.       Results for orders placed or performed in visit on 11/11/19  Novel Coronavirus, NAA ([facility])   Specimen: Nasopharyngeal(NP) swabs in vial transport medium   NASOPHARYNGE  TESTING  Result Value Ref Range   SARS-CoV-2, NAA Not Detected Not Detected    Assessment and Plan  Acute non-recurrent sinusitis, unspecified location Assessment & Plan: Acute, likely initial viral infection now with  bacterial superinfection.  Greater than 10 days of symptoms now with unilateral face pain although no fever. Will treat with amoxicillin 500 mg capsules to twice daily x 10 days. Continue nasal azelastine/fluticasone spray daily as well as daily NeilMed saline irrigation. Can use ibuprofen up to 800 mg  3 times daily as needed for sinus headache.   Other orders -     Amoxicillin; Take 2 capsules (1,000 mg total) by mouth 2 (two) times daily.  Dispense: 40 capsule; Refill: 0    Return if symptoms worsen or fail to improve.   Eliezer Lofts, MD

## 2022-11-08 NOTE — Assessment & Plan Note (Signed)
Acute, likely initial viral infection now with bacterial superinfection.  Greater than 10 days of symptoms now with unilateral face pain although no fever. Will treat with amoxicillin 500 mg capsules to twice daily x 10 days. Continue nasal azelastine/fluticasone spray daily as well as daily NeilMed saline irrigation. Can use ibuprofen up to 800 mg 3 times daily as needed for sinus headache.

## 2022-11-12 ENCOUNTER — Telehealth: Payer: Self-pay | Admitting: Family Medicine

## 2022-11-12 MED ORDER — AMOXICILLIN-POT CLAVULANATE 875-125 MG PO TABS: 1.0000 | ORAL_TABLET | Freq: Two times a day (BID) | ORAL | 0 refills | Status: DC

## 2022-11-12 NOTE — Telephone Encounter (Signed)
Pt called stating she saw [facility] on 11/08/22 & was prescribed the meds, amoxicillin (AMOXIL) 500 MG capsule & still feels bad. Pt is asking could something else be prescribed to help? Call back # [phone]

## 2022-11-12 NOTE — Telephone Encounter (Signed)
Call We can try broadening antibiotics to Augmentin but if not improving or any worsening shortness of breath.. follow up with PCP.  Stop amox and start augmentin sent to pharmacy.

## 2022-11-12 NOTE — Addendum Note (Signed)
Addended by: Eliezer Lofts E on: 11/12/2022 09:48 AM   Modules accepted: Orders

## 2022-11-12 NOTE — Telephone Encounter (Signed)
[place] Night - Client TELEPHONE ADVICE RECORD [facility] Patient Name: Tanya Cobb Gender: Female DOB: [birth year] Age: 57 Y 36 M 5 D Return Phone Number: [phone] (Primary) Address[place]  [address] Client [facility] [place] Night - Client Client Site [place] Provider Tanya Cobb - MD Contact Type Call Who Is Calling Patient / Member / Family / Caregiver Call Type Triage / Clinical Relationship To Patient Self Return Phone Number [phone] (Primary) Chief Complaint Fatigue (greater than THREE MONTHS old) Reason for Call Symptomatic / Request for [address] [place] said she was prescribed an antibiotic on Thursday but is not working and needs something stronger called in. She has sinus pressure, extreme fatigue, a lot of nasal drainage. She has had these Sx for two weeks. Translation No Nurse Assessment Nurse: [name], RN, Holly Date/Time (Eastern Time): 11/10/2022 10:57:29 AM Confirm and document reason for call. If symptomatic, describe symptoms. ---Caller said she was prescribed an antibiotic on Thursday but is not working and needs something stronger called in. She has sinus pressure, extreme fatigue, a lot of nasal drainage. She has had these Sx for two weeks. Does the patient have any new or worsening symptoms? ---Yes Will a triage be completed? ---Yes Related visit to physician within the last 2 weeks? ---Yes Does the PT have any chronic conditions? (i.e. diabetes, asthma, this includes High risk factors for pregnancy, etc.) ---Yes List chronic conditions. ---GERD Is this a behavioral Cobb or substance abuse call? ---No Guidelines Guideline Title Affirmed Question Affirmed Notes Nurse Date/Time (Eastern Time) Sinus Infection on Antibiotic Follow-up Call [1] Taking antibiotic < 72 hours (3 days) AND [2] sinus pain not  improved [name], RN, Tanya Cobb 11/10/2022 10:58:16 AM PLEASE NOTE: All timestamps contained within this report are represented as [place] Standard Time. CONFIDENTIALTY NOTICE: This fax transmission is intended only for the addressee. It contains information that is legally privileged, confidential or otherwise protected from use or disclosure. If you are not the intended recipient, you are strictly prohibited from reviewing, disclosing, copying using or disseminating any of this information or taking any action in reliance on or regarding this information. If you have received this fax in error, please notify us immediately by telephone so that we can arrange for its return to [place]. Phone: [phone], Toll-Free: [phone], Fax: [phone] Page: 2 of 2 Call Id: [id number] [place]. Time Tanya Cobb Time) Disposition Final User 11/10/2022 11:04:59 AM Home Care Yes [name], RN, Tanya Cobb Final Disposition 11/10/2022 11:04:59 AM Home Care Yes [name], RN, Tanya Cobb Caller Disagree/Comply Comply Caller Understands Yes PreDisposition Call Doctor Care Advice Given Per Guideline HOME CARE: * You should be able to treat this at home. REASSURANCE AND EDUCATION - BACTERIAL SINUSITIS AND TAKING AN ANTIBIOTIC: * Most bacterial infections do not get better after the first dose of an antibiotic. * Often there is no improvement the first day. * People gradually get better over 2 to 3 days. CALL BACK IF: * Difficulty breathing (and not relieved by cleaning out nose) * Fever lasts over 2 days on antibiotics * Symptoms don't improve by day 4 on antibiotics * You become worse CARE ADVICE given per Sinus Infection on Antibiotic Follow-Up Call (Adult) guideline. * Pseudoephedrine (Sudafed): Available over-the-counter in pill form. Typical adult dosage is two 30 mg tablets every 6 hours. * Do not use these medicines if you have high blood pressure, heart disease, prostate problems, or an overactive thyroid

## 2022-11-12 NOTE — Telephone Encounter (Signed)
Called and informed pt of this information. 

## 2023-01-04 ENCOUNTER — Other Ambulatory Visit: Payer: Self-pay | Admitting: Gastroenterology

## 2023-01-31 LAB — HM PAP SMEAR
HM Pap smear: NEGATIVE
HPV, high-risk: NEGATIVE

## 2023-02-19 ENCOUNTER — Other Ambulatory Visit: Payer: Self-pay | Admitting: Obstetrics and Gynecology

## 2023-02-19 DIAGNOSIS — Z1231 Encounter for screening mammogram for malignant neoplasm of breast: Secondary | ICD-10-CM

## 2023-02-21 LAB — LAB REPORT - SCANNED
A1c: 5.6
EGFR: 67

## 2023-03-06 ENCOUNTER — Other Ambulatory Visit: Payer: Self-pay | Admitting: Orthopedic Surgery

## 2023-03-06 DIAGNOSIS — M25562 Pain in left knee: Secondary | ICD-10-CM

## 2023-03-16 ENCOUNTER — Ambulatory Visit
Admission: RE | Admit: 2023-03-16 | Discharge: 2023-03-16 | Disposition: A | Discharge status: Home / Self Care | Payer: BC Managed Care – PPO | Source: Ambulatory Visit | Attending: Orthopedic Surgery | Admitting: Orthopedic Surgery

## 2023-03-16 DIAGNOSIS — M25562 Pain in left knee: Secondary | ICD-10-CM

## 2023-03-29 ENCOUNTER — Other Ambulatory Visit: Payer: BC Managed Care – PPO

## 2023-04-01 ENCOUNTER — Ambulatory Visit
Admission: RE | Admit: 2023-04-01 | Discharge: 2023-04-01 | Disposition: A | Discharge status: Home / Self Care | Payer: BC Managed Care – PPO | Source: Ambulatory Visit | Attending: Obstetrics and Gynecology | Admitting: Obstetrics and Gynecology

## 2023-04-01 DIAGNOSIS — Z1231 Encounter for screening mammogram for malignant neoplasm of breast: Secondary | ICD-10-CM

## 2023-04-08 ENCOUNTER — Ambulatory Visit: Payer: BC Managed Care – PPO

## 2023-06-29 ENCOUNTER — Other Ambulatory Visit: Payer: Self-pay | Admitting: Gastroenterology

## 2023-07-17 ENCOUNTER — Telehealth: Payer: Self-pay | Admitting: Gastroenterology

## 2023-07-17 ENCOUNTER — Other Ambulatory Visit: Payer: Self-pay | Admitting: Gastroenterology

## 2023-07-17 MED ORDER — ESOMEPRAZOLE MAGNESIUM 40 MG PO CPDR: DELAYED_RELEASE_CAPSULE | ORAL | 0 refills | Status: DC

## 2023-07-17 NOTE — Telephone Encounter (Signed)
Prescription sent to patient's pharmacy until scheduled appt in December. Patient notified and patient verbalized understanding.

## 2023-07-17 NOTE — Telephone Encounter (Signed)
Inbound call from patient, requesting refill for Nexium sent to [facility] in [place].

## 2023-09-02 LAB — LIPID PANEL
Cholesterol: 207 — AB (ref 0–200)
HDL: 79 — AB (ref 35–70)
LDL Cholesterol: 115
LDl/HDL Ratio: 1.5
Triglycerides: 73 (ref 40–160)

## 2023-09-02 LAB — VITAMIN D 25 HYDROXY (VIT D DEFICIENCY, FRACTURES): Vit D, 25-Hydroxy: 40.8

## 2023-09-06 ENCOUNTER — Encounter: Payer: Self-pay | Admitting: Family Medicine

## 2023-09-06 ENCOUNTER — Ambulatory Visit: Payer: BC Managed Care – PPO | Admitting: Family Medicine

## 2023-09-06 VITALS — BP 112/70 | HR 82 | Temp 98.5°F | Ht 67.0 in | Wt 147.2 lb

## 2023-09-06 DIAGNOSIS — R413 Other amnesia: Secondary | ICD-10-CM

## 2023-09-06 DIAGNOSIS — R410 Disorientation, unspecified: Secondary | ICD-10-CM

## 2023-09-06 LAB — CBC WITH DIFFERENTIAL/PLATELET
Basophils Absolute: 0.1 10*3/uL (ref 0.0–0.1)
Basophils Relative: 0.9 % (ref 0.0–3.0)
Eosinophils Absolute: 0.1 10*3/uL (ref 0.0–0.7)
Eosinophils Relative: 1.5 % (ref 0.0–5.0)
HCT: 42.7 % (ref 36.0–46.0)
Hemoglobin: 14.1 g/dL (ref 12.0–15.0)
Lymphocytes Relative: 24 % (ref 12.0–46.0)
Lymphs Abs: 1.9 10*3/uL (ref 0.7–4.0)
MCHC: 33.1 g/dL (ref 30.0–36.0)
MCV: 93.4 fL (ref 78.0–100.0)
Monocytes Absolute: 0.7 10*3/uL (ref 0.1–1.0)
Monocytes Relative: 8.6 % (ref 3.0–12.0)
Neutro Abs: 5.2 10*3/uL (ref 1.4–7.7)
Neutrophils Relative %: 65 % (ref 43.0–77.0)
Platelets: 214 10*3/uL (ref 150.0–400.0)
RBC: 4.57 Mil/uL (ref 3.87–5.11)
RDW: 12.7 % (ref 11.5–15.5)
WBC: 8 10*3/uL (ref 4.0–10.5)

## 2023-09-06 LAB — COMPREHENSIVE METABOLIC PANEL
ALT: 13 U/L (ref 0–35)
AST: 26 U/L (ref 0–37)
Albumin: 4.5 g/dL (ref 3.5–5.2)
Alkaline Phosphatase: 72 U/L (ref 39–117)
BUN: 24 mg/dL — ABNORMAL HIGH (ref 6–23)
CO2: 31 meq/L (ref 19–32)
Calcium: 9.7 mg/dL (ref 8.4–10.5)
Chloride: 101 meq/L (ref 96–112)
Creatinine, Ser: 1.1 mg/dL (ref 0.40–1.20)
GFR: 55.07 mL/min — ABNORMAL LOW (ref >60.00)
Glucose, Bld: 94 mg/dL (ref 70–99)
Potassium: 4.6 meq/L (ref 3.5–5.1)
Sodium: 140 meq/L (ref 135–145)
Total Bilirubin: 0.5 mg/dL (ref 0.2–1.2)
Total Protein: 6.7 g/dL (ref 6.0–8.3)

## 2023-09-06 LAB — TSH: TSH: 1.4 u[IU]/mL (ref 0.35–5.50)

## 2023-09-06 LAB — VITAMIN B12: Vitamin B-12: 556 pg/mL (ref 211–911)

## 2023-09-06 NOTE — Progress Notes (Unsigned)
She had an episode of confusion 10 years ago, then another episode last week.    Recent event lasted a few minutes.  She was riding in the car, not driving, was trying to think about a credit card account and got confused.  Then arrived at home shortly thereafter.  Then felt tired.  Slept for 2 hours.  Then after that she eventually got back to normal, about 15 minutes after the nap.    No clear trigger.  Husband witnessed the event but didn't notice a problem until pt told him that she had sx. No LOC.  She could still move her arms and legs, could still speak and could explain what she couldn't remember but not the exact details.  No double vision or other focal neuro changes.   No clear trigger with the event.  No FCNAVD.  She had travelled to [place] about 2 weeks ago.  She had time zone adjustment from that.  No known h/o SZ, no FH.    Father has Alzheimers.    Meds, vitals, and allergies reviewed.   ROS: Per HPI unless specifically indicated in ROS section   GEN: nad, alert and oriented HEENT: ncat NECK: supple w/o LA CV: rrr.  PULM: ctab, no inc wob ABD: soft, +bs EXT: no edema SKIN: well perfused.  CN 2-12 wnl B, S/S/DTR wnl x4 30/30 MMSE and normal neuro exam.   30 minutes were devoted to patient care in this encounter (this includes time spent reviewing the patient's file/history, interviewing and examining the patient, counseling/reviewing plan with patient).

## 2023-09-06 NOTE — Patient Instructions (Addendum)
Please request your record from [facility], especially 2024 labs.   Go to the lab on the way out.   If you have mychart we'll likely use that to update you.     You should get a call about the head CT.   Take care.  Glad to see you.

## 2023-09-08 DIAGNOSIS — R410 Disorientation, unspecified: Secondary | ICD-10-CM | POA: Insufficient documentation

## 2023-09-08 NOTE — Assessment & Plan Note (Signed)
Of unclear source.  Transient episode of confusion without loss of consciousness or any other focal neurologic symptoms at the time.  She had memory change/difficulty during the event but not otherwise.  Normal memory testing today.  She did not have obvious seizure activity.  She had a similar event approximately 10 years ago.  No symptoms now.  Discussed options.  Given the timeline and normal exam today, still okay for outpatient follow-up.  Check basic labs and head CT.  See notes on those when resulted.  If any other additional symptoms or recurrent episodes then advised to go to the ER.  She agrees with plan.

## 2023-09-10 ENCOUNTER — Encounter: Payer: Self-pay | Admitting: Family Medicine

## 2023-09-10 ENCOUNTER — Ambulatory Visit
Admission: RE | Admit: 2023-09-10 | Discharge: 2023-09-10 | Disposition: A | Discharge status: Home / Self Care | Payer: BC Managed Care – PPO | Source: Ambulatory Visit | Attending: Family Medicine | Admitting: Family Medicine

## 2023-09-10 DIAGNOSIS — R413 Other amnesia: Secondary | ICD-10-CM

## 2023-09-10 DIAGNOSIS — R410 Disorientation, unspecified: Secondary | ICD-10-CM

## 2023-09-11 ENCOUNTER — Other Ambulatory Visit: Payer: Self-pay | Admitting: Family Medicine

## 2023-09-11 DIAGNOSIS — R413 Other amnesia: Secondary | ICD-10-CM

## 2023-09-11 DIAGNOSIS — R410 Disorientation, unspecified: Secondary | ICD-10-CM

## 2023-09-12 ENCOUNTER — Encounter: Payer: Self-pay | Admitting: Physician Assistant

## 2023-09-12 ENCOUNTER — Encounter: Payer: Self-pay | Admitting: Family Medicine

## 2023-10-01 ENCOUNTER — Encounter: Payer: Self-pay | Admitting: Gastroenterology

## 2023-10-01 ENCOUNTER — Ambulatory Visit: Payer: BC Managed Care – PPO | Admitting: Gastroenterology

## 2023-10-01 VITALS — BP 104/64 | HR 83 | Ht 67.0 in | Wt 148.0 lb

## 2023-10-01 DIAGNOSIS — K2970 Gastritis, unspecified, without bleeding: Secondary | ICD-10-CM

## 2023-10-01 DIAGNOSIS — K219 Gastro-esophageal reflux disease without esophagitis: Secondary | ICD-10-CM

## 2023-10-01 DIAGNOSIS — Z860101 Personal history of adenomatous and serrated colon polyps: Secondary | ICD-10-CM

## 2023-10-01 DIAGNOSIS — R1310 Dysphagia, unspecified: Secondary | ICD-10-CM

## 2023-10-01 DIAGNOSIS — R1312 Dysphagia, oropharyngeal phase: Secondary | ICD-10-CM

## 2023-10-01 MED ORDER — ESOMEPRAZOLE MAGNESIUM 40 MG PO CPDR: DELAYED_RELEASE_CAPSULE | ORAL | 3 refills | Status: AC

## 2023-10-01 NOTE — Progress Notes (Signed)
    Assessment     GERD with LPR and oropharyngeal dysphagia  Gastritis Personal history of adenomatous colon polyps   Recommendations    Continue Nexium 40 mg daily, follow antireflux measures Surveillance colonoscopy recommended in August 2029 with Dr. Doy Hutching ENT and Allergy follow-up to optimize symptom management. REV in 1 year with Dr. Doy Hutching    HPI    This is a 57 year old female with GERD and LPR who relates intermittent difficulty swallowing pills and food.  She clearly associates her symptoms to times when she has increased sinus drainage.  Her symptoms are seasonal.  When her ENT symptoms are controlled she has no difficulty with swallowing.  She has very rare episodes of reflux.   Labs / Imaging       Latest Ref Rng & Units 09/06/2023   12:36 PM 09/28/2019    9:21 AM 06/03/2018   12:52 PM  Hepatic Function  Total Protein 6.0 - 8.3 g/dL 6.7  6.8  6.9   Albumin 3.5 - 5.2 g/dL 4.5  4.3  4.2   AST 0 - 37 U/L 26  20  19    ALT 0 - 35 U/L 13  10  10    Alk Phosphatase 39 - 117 U/L 72  64  44   Total Bilirubin 0.2 - 1.2 mg/dL 0.5  0.5  0.5        Latest Ref Rng & Units 09/06/2023   12:36 PM 09/28/2019    9:21 AM 06/03/2018   12:52 PM  CBC  WBC 4.0 - 10.5 K/uL 8.0  4.7  5.8   Hemoglobin 12.0 - 15.0 g/dL 81.1  91.4  78.2   Hematocrit 36.0 - 46.0 % 42.7  41.3  38.8   Platelets 150.0 - 400.0 K/uL 214.0  179.0  186.0     Current Medications, Allergies, Past Medical History, Past Surgical History, Family History and Social History were reviewed in [employer] record.   Physical Exam: General: Well developed, well nourished, no acute distress Head: Normocephalic and atraumatic Eyes: Sclerae anicteric, EOMI Ears: Normal auditory acuity Mouth: No deformities or lesions noted Lungs: Clear throughout to auscultation Heart: Regular rate and rhythm; No murmurs, rubs or bruits Abdomen: Soft, non tender and non distended. No masses,  hepatosplenomegaly or hernias noted. Normal Bowel sounds Rectal: Not done Musculoskeletal: Symmetrical with no gross deformities  Pulses:  Normal pulses noted Extremities: No edema or deformities noted Neurological: Alert oriented x 4, grossly nonfocal Psychological:  Alert and cooperative. Normal mood and affect   Tresea Heine T. Russella Dar, MD 10/01/2023, 1:22 PM

## 2023-10-01 NOTE — Patient Instructions (Signed)
We have sent the following medications to your pharmacy for you to pick up at your convenience: nexium.  Follow up with Dr. Doy Hutching one year.   The [facility] providers would like to encourage you to use [facility] to communicate with providers for non-urgent requests or questions.  Due to long hold times on the telephone, sending your provider a message by [facility] may be a faster and more efficient way to get a response.  Please allow 48 business hours for a response.  Please remember that this is for non-urgent requests.   Thank you for choosing me and [facility].  Venita Lick. Pleas Koch., MD., Clementeen Graham

## 2023-10-02 NOTE — Progress Notes (Addendum)
Assessment/Plan:   Tanya Cobb is a very pleasant 57 y.o. year old RH female with a history of GERD, osteopenia,  history of breast cancer status post XRT, arthritis, seen today for evaluation of memory concerns. MoCA today is 29/30.   Workup is in progress. Most recent CT head is normal for age without acute findings. Etiology is unclear, but there may be a question of TGA. This episode happened twice over a decade and always following vigorous activity. Will evaluate for seizure activity as well. Patient is able to participate on her IADLs and continues to drive without significant difficulties.    Memory Impairment of unclear etiology  MRI brain without contrast to assess for underlying structural abnormality and assess vascular load  EEG for further investigation of TAA, will consider 24 h EEG pending on the results.  Continue to control mood as per PCP Recommend good control of cardiovascular risk factors [place] up in 1 month Recommend alcohol reduction   Subjective:   The patient is accompanied by her husband  who supplements the history.   How long did patient have memory difficulties?   She had concerns because recently she was riding in her car as a passenger, was trying to think about her credit card versus her gas card "which  one is for what" lasting for about 3 mins then resolving. She had arrived to home felt very tired, and slept for  2 hours, and then went back to normal after waking.  She is unclear of what triggered this.  Denies any  difficulty remembering new information, conversations and names.  Long-term memory is good. Of note, she had a similar episode in 2013 with negative workup repeats oneself?  Endorsed Disoriented when walking into a room?  Patient denies    Anxiety and agitation? Denies  Repeatedly asking questions  for example, "What am I doing here?" or "How did we get here?" No Retaining personal identity during the episode? Yes  Able to  complete complex routine tasks during the episode? Yes Episodes end in 1-8 hours, no more than 24 Yes  Sudden onset of confusion that includes memory loss, seen by a witness? Yes    Hard physical activity She had worked out vigorously at the [employer] with a Geophysicist/field seismologist. She takes Creatine  Sudden exposure to very hot or very cold water No Emotional stress No  Mild head trauma No No signs of seizures during the period of amnesia No History of migraines No  Leaving objects in unusual places? Denies.   Wandering behavior?  Denies.   Any personality changes?  Denies.   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 No Tongue biting? No   Any sleep changes?   Sleeps well. Occasional  vivid dreams, REM behavior or sleepwalking   Sleep apnea?  Denies   Any hygiene concerns?  Denies   Independent of bathing and dressing?  Endorsed  Does the patient needs help with medications? Patient is in charge   Who is in charge of the finances? Patient is in charge     Any changes in appetite?  Denies.   Patient have trouble swallowing? Denies.   Does the patient cook?  Not much. Any kitchen accidents such as leaving the stove on? Denies.   Any history of headaches?   Denies.   Chronic pain ? Denies.   Ambulates with difficulty? When  knee not hurting Recent falls or head injuries? Denies.   Vision changes? Denies.   Unilateral weakness, numbness or tingling? Denies.   Any tremors?   Denies.   Any anosmia?  Denies.   Any incontinence of urine? Denies. No blood in urine , denies history of rhabdo  Any bowel dysfunction? Denies.      Patient lives with husband History of heavy alcohol intake? 3-4 x a week, 3-4 drinks  History of heavy tobacco use? Denies.   Family history of dementia?  Father had Alzheimer's disease Does patient drive? Yes, denies any issues  Recent labs November 2024 B12 556, TSH 1.4, chemistries normal,  CBC normal.  Vitamin D normal, lipid panel with TC 207 HDL 79.  Rest of lab work  normal  CT of the head without contrast was negative for acute findings, normal  volume without atrophy.  Past Medical History:  Diagnosis Date   Arthritis    Breast cancer (HCC) 12/2017   Cervical strain    GERD (gastroesophageal reflux disease)    w/ LPR   Osteopenia    Personal history of radiation therapy    PONV (postoperative nausea and vomiting)    Tubular adenoma of colon 05/2021     Past Surgical History:  Procedure Laterality Date   AUGMENTATION MAMMAPLASTY Bilateral    BREAST LUMPECTOMY  12/2017   COLONOSCOPY  05/09/2011   Eye lid surgery     KNEE SURGERY  09/2021   NECK SURGERY  2008   SHOULDER ARTHROSCOPY  2016   UPPER GASTROINTESTINAL ENDOSCOPY  07/21/2018     Allergies  Allergen Reactions   Molds & Smuts     Cough/wheeze with exposure.      Current Outpatient Medications  Medication Instructions   albuterol (VENTOLIN HFA) 108 (90 Base) MCG/ACT inhaler 1 puff, Every 4 hours PRN   Azelastine-Fluticasone 137-50 MCG/ACT SUSP 1 spray, 2 times daily   budesonide (PULMICORT) 180 MCG/ACT inhaler 2 puffs, 2 times daily   calcium carbonate (OS-CAL) 600 mg, Daily   COLLAGEN PO Take by mouth. Powder daily   Creatine POWD 1 Scoop, Daily   esomeprazole (NEXIUM) 40 MG capsule TAKE ONE (1) CAPSULE BY MOUTH 2 TIMES DAILY BEFORE A MEAL   levocetirizine (XYZAL) 5 mg, Every evening   montelukast (SINGULAIR) 10 mg, Daily   Multiple Vitamin (MULTIVITAMIN) tablet 1 tablet, Daily   Omega-3 Fatty Acids (FISH OIL) 1000 MG CAPS 1 capsule, Daily     VITALS:   Vitals:   10/04/23 0750  BP: 117/78  Pulse: 66  Resp: 20  SpO2: 98%  Weight: 148 lb (67.1 kg)  Height: 5\' 7"  (1.702 m)      PHYSICAL EXAM   HEENT:  Normocephalic, atraumatic. The superficial temporal arteries are without ropiness or tenderness. Cardiovascular: Regular rate and rhythm. Lungs: Clear to auscultation  bilaterally. Neck: There are no carotid bruits noted bilaterally.  NEUROLOGICAL:    10/04/2023   11:00 AM  Montreal Cognitive Assessment   Visuospatial/ Executive (0/5) 5  Naming (0/3) 3  Attention: Read list of digits (0/2) 2  Attention: Read list of letters (0/1) 1  Attention: Serial 7 subtraction starting at 100 (0/3) 3  Language: Repeat phrase (0/2) 2  Language : Fluency (0/1) 1  Abstraction (0/2) 1  Delayed Recall (0/5) 5  Orientation (0/6) 6  Total 29  Adjusted Score (based on education) 29        No data to display  Orientation:  Alert and oriented to person, place and time. No aphasia or dysarthria. Fund of knowledge is appropriate. Recent memory and remote memory intact.  Attention and concentration are normal.  Able to name objects and repeat phrases. Delayed recall  5/5 Cranial nerves: There is good facial symmetry. Extraocular muscles are intact and visual fields are full to confrontational testing. Speech is fluent and clear. No tongue deviation. Hearing is intact to conversational tone. Tone: Tone is good throughout. Sensation: Sensation is intact to light touch. Vibration is intact at the bilateral big toe.  Coordination: The patient has no difficulty with RAM's or FNF bilaterally. Normal finger to nose  Motor: Strength is 5/5 in the bilateral upper and lower extremities. There is no pronator drift. There are no fasciculations noted. DTR's: Deep tendon reflexes are 2/4 bilaterally. Gait and Station: The patient is able to ambulate without difficulty The patient is able to heel toe walk . Gait is cautious and narrow. The patient is able to ambulate in a tandem fashion.       Thank you for allowing [place] the opportunity to participate in the care of this nice patient. Please do not hesitate to contact us for any questions or concerns.   Total time spent on today's visit was 6- minutes dedicated to this patient today, preparing to see patient, examining the  patient, ordering tests and/or medications and counseling the patient, documenting clinical information in the EHR or other health record, independently interpreting results and communicating results to the patient/family, discussing treatment and goals, answering patient's questions and coordinating care.  Cc:  Joaquim Nam, MD  Marlowe Kays 10/04/2023 11:29 AM

## 2023-10-04 ENCOUNTER — Ambulatory Visit: Payer: BC Managed Care – PPO | Admitting: Physician Assistant

## 2023-10-04 ENCOUNTER — Other Ambulatory Visit: Payer: BC Managed Care – PPO

## 2023-10-04 ENCOUNTER — Encounter: Payer: Self-pay | Admitting: Physician Assistant

## 2023-10-04 ENCOUNTER — Ambulatory Visit: Payer: BC Managed Care – PPO

## 2023-10-04 VITALS — BP 117/78 | HR 66 | Resp 20 | Ht 67.0 in | Wt 148.0 lb

## 2023-10-04 DIAGNOSIS — R413 Other amnesia: Secondary | ICD-10-CM | POA: Diagnosis not present

## 2023-10-04 NOTE — Patient Instructions (Addendum)
It was a pleasure to see you today at our office.   Recommendations:   MRI of the brain, the radiology office will call you to arrange you appointment  [phone] Check labs today  [address] EEG  Follow up in  1 month Recommend visiting the website : " Dementia [facility]" to better understand some behaviors related to memory loss.    For psychiatric meds, mood meds: Please have your primary care physician manage these medications.  If you have any severe symptoms of a stroke, or other severe issues such as confusion,severe chills or fever, etc call 911 or go to the ER as you may need to be evaluated further   For guidance regarding [facility] and if placement were needed at the facility, contact Social Worker tel: [phone]  For assessment of decision of mental capacity and competency:  Call Dr. Erick Blinks, geriatric psychiatrist at [phone]  Counseling regarding caregiver distress, including caregiver depression, anxiety and issues regarding community resources, adult day care programs, adult living facilities, or memory care questions:  please contact your  Primary Doctor's Social Worker   Whom to call: Memory  decline, memory medications: Call our office [phone]    [email or web address]   RECOMMENDATIONS FOR ALL PATIENTS WITH MEMORY PROBLEMS: 1. Continue to exercise (Recommend 30 minutes of walking everyday, or 3 hours every week) 2. Increase social interactions - continue going to [place] and enjoy social gatherings with friends and family 3. Eat healthy, avoid fried foods and eat more fruits and vegetables 4. Maintain adequate blood pressure, blood sugar, and blood cholesterol level. Reducing the risk of stroke and cardiovascular disease also helps promoting better memory. 5. Avoid stressful situations. Live a simple life and avoid aggravations. Organize your time and prepare for the next  day in anticipation. 6. Sleep well, avoid any interruptions of sleep and avoid any distractions in the bedroom that may interfere with adequate sleep quality 7. Avoid sugar, avoid sweets as there is a strong link between excessive sugar intake, diabetes, and cognitive impairment We discussed the Mediterranean diet, which has been shown to help patients reduce the risk of progressive memory disorders and reduces cardiovascular risk. This includes eating fish, eat fruits and green leafy vegetables, nuts like almonds and hazelnuts, walnuts, and also use olive oil. Avoid fast foods and fried foods as much as possible. Avoid sweets and sugar as sugar use has been linked to worsening of memory function.  There is always a concern of gradual progression of memory problems. If this is the case, then we may need to adjust level of care according to patient needs. Support, both to the patient and caregiver, should then be put into place.         DRIVING: Regarding driving, in patients with progressive memory problems, driving will be impaired. We advise to have someone else do the driving if trouble finding directions or if minor accidents are reported. Independent driving assessment is available to determine safety of driving.   If you are interested in the driving assessment, you can contact the following:  [facility] in [place] [phone]  [facility] [phone]  [facility] [phone]  [facility] [phone] or [phone]   FALL PRECAUTIONS: Be cautious when walking. Scan the area for obstacles that may increase the risk of trips and falls. When getting up in the mornings, sit up at the edge of the bed for a few minutes before getting out of bed. Consider elevating  the bed at the head end to avoid drop of blood pressure when getting up. Walk always in a well-lit room (use night lights in the walls). Avoid area rugs or power cords from  appliances in the middle of the walkways. Use a walker or a cane if necessary and consider physical therapy for balance exercise. Get your eyesight checked regularly.  FINANCIAL OVERSIGHT: Supervision, especially oversight when making financial decisions or transactions is also recommended.  HOME SAFETY: Consider the safety of the kitchen when operating appliances like stoves, microwave oven, and blender. Consider having supervision and share cooking responsibilities until no longer able to participate in those. Accidents with firearms and other hazards in the house should be identified and addressed as well.   ABILITY TO BE LEFT ALONE: If patient is unable to contact 911 operator, consider using LifeLine, or when the need is there, arrange for someone to stay with patients. Smoking is a fire hazard, consider supervision or cessation. Risk of wandering should be assessed by caregiver and if detected at any point, supervision and safe proof recommendations should be instituted.  MEDICATION SUPERVISION: Inability to self-administer medication needs to be constantly addressed. Implement a mechanism to ensure safe administration of the medications.      Mediterranean Diet A Mediterranean diet refers to food and lifestyle choices that are based on the traditions of countries located on the [place]. This way of eating has been shown to help prevent certain conditions and improve outcomes for people who have chronic diseases, like kidney disease and heart disease. What are tips for following this plan? Lifestyle  Cook and eat meals together with your family, when possible. Drink enough fluid to keep your urine clear or pale yellow. Be physically active every day. This includes: Aerobic exercise like running or swimming. Leisure activities like gardening, walking, or housework. Get 7-8 hours of sleep each night. If recommended by your health care provider, drink red wine in moderation.  This means 1 glass a day for nonpregnant women and 2 glasses a day for men. A glass of wine equals 5 oz (150 mL). Reading food labels  Check the serving size of packaged foods. For foods such as rice and pasta, the serving size refers to the amount of cooked product, not dry. Check the total fat in packaged foods. Avoid foods that have saturated fat or trans fats. Check the ingredients list for added sugars, such as corn syrup. Shopping  At the grocery store, buy most of your food from the areas near the walls of the store. This includes: Fresh fruits and vegetables (produce). Grains, beans, nuts, and seeds. Some of these may be available in unpackaged forms or large amounts (in bulk). Fresh seafood. Poultry and eggs. Low-fat dairy products. Buy whole ingredients instead of prepackaged foods. Buy fresh fruits and vegetables in-season from local farmers markets. Buy frozen fruits and vegetables in resealable bags. If you do not have access to quality fresh seafood, buy precooked frozen shrimp or canned fish, such as tuna, salmon, or sardines. Buy small amounts of raw or cooked vegetables, salads, or olives from the deli or salad bar at your store. Stock your pantry so you always have certain foods on hand, such as olive oil, canned tuna, canned tomatoes, rice, pasta, and beans. Cooking  Cook foods with extra-virgin olive oil instead of using butter or other vegetable oils. Have meat as a side dish, and have vegetables or grains as your main dish. This means having meat in small  portions or adding small amounts of meat to foods like pasta or stew. Use beans or vegetables instead of meat in common dishes like chili or lasagna. Experiment with different cooking methods. Try roasting or broiling vegetables instead of steaming or sauteing them. Add frozen vegetables to soups, stews, pasta, or rice. Add nuts or seeds for added healthy fat at each meal. You can add these to yogurt, salads, or  vegetable dishes. Marinate fish or vegetables using olive oil, lemon juice, garlic, and fresh herbs. Meal planning  Plan to eat 1 vegetarian meal one day each week. Try to work up to 2 vegetarian meals, if possible. Eat seafood 2 or more times a week. Have healthy snacks readily available, such as: Vegetable sticks with hummus. Greek yogurt. Fruit and nut trail mix. Eat balanced meals throughout the week. This includes: Fruit: 2-3 servings a day Vegetables: 4-5 servings a day Low-fat dairy: 2 servings a day Fish, poultry, or lean meat: 1 serving a day Beans and legumes: 2 or more servings a week Nuts and seeds: 1-2 servings a day Whole grains: 6-8 servings a day Extra-virgin olive oil: 3-4 servings a day Limit red meat and sweets to only a few servings a month What are my food choices? Mediterranean diet Recommended Grains: Whole-grain pasta. Brown rice. Bulgar wheat. Polenta. Couscous. Whole-wheat bread. Orpah Cobb. Vegetables: Artichokes. Beets. Broccoli. Cabbage. Carrots. Eggplant. Green beans. Chard. Kale. Spinach. Onions. Leeks. Peas. Squash. Tomatoes. Peppers. Radishes. Fruits: Apples. Apricots. Avocado. Berries. Bananas. Cherries. Dates. Figs. Grapes. Lemons. Melon. Oranges. Peaches. Plums. Pomegranate. Meats and other protein foods: Beans. Almonds. Sunflower seeds. Pine nuts. Peanuts. Cod. Salmon. Scallops. Shrimp. Tuna. Tilapia. Clams. Oysters. Eggs. Dairy: Low-fat milk. Cheese. Greek yogurt. Beverages: Water. Red wine. Herbal tea. Fats and oils: Extra virgin olive oil. Avocado oil. Grape seed oil. Sweets and desserts: [place] yogurt with honey. Baked apples. Poached pears. Trail mix. Seasoning and other foods: Basil. Cilantro. Coriander. Cumin. Mint. Parsley. Sage. Rosemary. Tarragon. Garlic. Oregano. Thyme. Pepper. Balsalmic vinegar. Tahini. Hummus. Tomato sauce. Olives. Mushrooms. Limit these Grains: Prepackaged pasta or rice dishes. Prepackaged cereal with added  sugar. Vegetables: Deep fried potatoes (french fries). Fruits: Fruit canned in syrup. Meats and other protein foods: Beef. Pork. Lamb. Poultry with skin. Hot dogs. Tomasa Blase. Dairy: Ice cream. Sour cream. Whole milk. Beverages: Juice. Sugar-sweetened soft drinks. Beer. Liquor and spirits. Fats and oils: Butter. Canola oil. Vegetable oil. Beef fat (tallow). Lard. Sweets and desserts: Cookies. Cakes. Pies. Candy. Seasoning and other foods: Mayonnaise. Premade sauces and marinades. The items listed may not be a complete list. Talk with your dietitian about what dietary choices are right for you. Summary The Mediterranean diet includes both food and lifestyle choices. Eat a variety of fresh fruits and vegetables, beans, nuts, seeds, and whole grains. Limit the amount of red meat and sweets that you eat. Talk with your health care provider about whether it is safe for you to drink red wine in moderation. This means 1 glass a day for nonpregnant women and 2 glasses a day for men. A glass of wine equals 5 oz (150 mL). This information is not intended to replace advice given to you by your health care provider. Make sure you discuss any questions you have with your health care provider. Document Released: 05/31/2016 Document Revised: 07/03/2016 Document Reviewed: 05/31/2016 Elsevier Interactive Patient Education  2017 [employer].

## 2023-10-10 LAB — VITAMIN B1: Vitamin B1 (Thiamine): 13 nmol/L (ref 8–30)

## 2023-10-10 NOTE — Progress Notes (Signed)
B1 on the lower normal, recommend B1 over the counter, 100 mg daily

## 2023-10-25 ENCOUNTER — Other Ambulatory Visit: Payer: BC Managed Care – PPO

## 2023-10-31 ENCOUNTER — Ambulatory Visit: Payer: 59

## 2023-10-31 DIAGNOSIS — R413 Other amnesia: Secondary | ICD-10-CM | POA: Diagnosis not present

## 2023-10-31 NOTE — Progress Notes (Signed)
 EEG complete - results pending

## 2023-11-01 ENCOUNTER — Encounter: Payer: Self-pay | Admitting: Family Medicine

## 2023-11-03 NOTE — Procedures (Signed)
 ELECTROENCEPHALOGRAM REPORT  Date of Study: 10/31/2023  Patient's Name: Tanya Cobb MRN: [id number] Date of Birth: May 16, 1966  Referring Provider: Camie Sevin, PA-C  Clinical History: This is a 58 year old woman with episodes of confusion. EEG for classification.  CNS Active Medications: none  Technical Summary: A multichannel digital 1-hour EEG recording measured by the international 10-20 system with electrodes applied with paste and impedances below 5000 ohms performed in our laboratory with EKG monitoring in an awake and asleep patient.  Hyperventilation and photic stimulation were performed.  The digital EEG was referentially recorded, reformatted, and digitally filtered in a variety of bipolar and referential montages for optimal display.    Description: The patient is awake and asleep during the recording.  During maximal wakefulness, there is a symmetric, medium voltage 9-10 Hz posterior dominant rhythm that attenuates with eye opening.  The record is symmetric.  During drowsiness and sleep, there is an increase in theta slowing of the background.  Vertex waves and symmetric sleep spindles were seen. Hyperventilation and photic stimulation did not elicit any abnormalities.  There were no epileptiform discharges or electrographic seizures seen.    EKG lead was unremarkable.  Impression: This 1-hour awake and asleep EEG is normal.    Clinical Correlation: A normal EEG does not exclude a clinical diagnosis of epilepsy.  If further clinical questions remain, prolonged EEG may be helpful.  Clinical correlation is advised.   Darice Shivers, M.D.

## 2023-11-06 ENCOUNTER — Ambulatory Visit: Payer: BC Managed Care – PPO | Admitting: Physician Assistant

## 2023-11-14 ENCOUNTER — Ambulatory Visit
Admission: RE | Admit: 2023-11-14 | Discharge: 2023-11-14 | Disposition: A | Discharge status: Home / Self Care | Payer: 59 | Source: Ambulatory Visit | Attending: Physician Assistant | Admitting: Physician Assistant

## 2023-11-15 ENCOUNTER — Telehealth: Payer: Self-pay | Admitting: Physician Assistant

## 2023-11-15 NOTE — Telephone Encounter (Signed)
I told patient I would call with results after providers releases results/she thanked me for calling.

## 2023-11-15 NOTE — Telephone Encounter (Signed)
Pt called in stating she saw her MRI results on mychart and is concerned. She would like a call back to talk with someone

## 2023-11-18 NOTE — Telephone Encounter (Signed)
Patient is returning a call to our office per her Voicemail

## 2023-11-18 NOTE — Progress Notes (Signed)
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

## 2023-11-19 NOTE — Telephone Encounter (Signed)
Caller return call for MRI results

## 2023-11-20 NOTE — Progress Notes (Signed)
Spoke with patient regarding the MRI findings, pituitary lesion. She has requested to follow on that lesion with Dr. Pearson Grippe at [facility] . Will place a referral, she was very appreciative.

## 2023-11-26 ENCOUNTER — Telehealth: Payer: Self-pay | Admitting: Physician Assistant

## 2023-11-26 NOTE — Telephone Encounter (Signed)
Pt. Cld and left message to have referral sent to [facility]

## 2023-11-27 NOTE — Telephone Encounter (Signed)
 This has been sent to [facility], will have to call patient.

## 2023-11-27 NOTE — Telephone Encounter (Signed)
 Sent new referral to [facility]

## 2023-11-27 NOTE — Telephone Encounter (Signed)
Patient is going to call back with number to and fax from [facility]

## 2023-11-27 NOTE — Telephone Encounter (Signed)
 Tanya Cobb  Phone [phone] Fax [phone]

## 2023-11-29 NOTE — Telephone Encounter (Signed)
 Patient has been scheduled at [facility] for March 17,2025. She was aware and thanked me for the referral status update.

## 2024-01-07 ENCOUNTER — Other Ambulatory Visit: Payer: Self-pay | Admitting: Obstetrics and Gynecology

## 2024-01-07 DIAGNOSIS — Z1231 Encounter for screening mammogram for malignant neoplasm of breast: Secondary | ICD-10-CM

## 2024-03-18 ENCOUNTER — Ambulatory Visit: Admitting: Primary Care

## 2024-04-02 ENCOUNTER — Ambulatory Visit

## 2024-04-17 ENCOUNTER — Ambulatory Visit
Admission: RE | Admit: 2024-04-17 | Discharge: 2024-04-17 | Disposition: A | Discharge status: Home / Self Care | Source: Ambulatory Visit | Attending: Obstetrics and Gynecology | Admitting: Obstetrics and Gynecology

## 2024-04-17 DIAGNOSIS — Z1231 Encounter for screening mammogram for malignant neoplasm of breast: Secondary | ICD-10-CM

## 2024-07-01 ENCOUNTER — Ambulatory Visit: Payer: Self-pay

## 2024-07-01 NOTE — Telephone Encounter (Signed)
 Noted. Thanks.  Appreciate help from Dr. Avelina.

## 2024-07-01 NOTE — Telephone Encounter (Signed)
 Appointment made for Friday 07/03/2024 with Dr Greig Ring at 8:40AM  FYI Only or Action Required?: FYI only for provider.  Patient was last seen in primary care on 09/06/2023 by Cleatus Arlyss RAMAN, MD.  Called Nurse Triage reporting Fatigue, Neck Pain, Nausea, and Headache.  Symptoms began 6 months ago.  Interventions attempted: OTC medications: ibuprofen sometimes and it helps some, Rest, hydration, or home remedies, and Other: neck exercises.  Symptoms are: gradually worsening.  Triage Disposition: See PCP When Office is Open (Within 3 Days)  Patient/caregiver understands and will follow disposition?: Yes   Copied from CRM [id number]. Topic: Clinical - Red Word Triage >> Jul 01, 2024 12:49 PM Pinkey ORN wrote: Red Word that prompted transfer to Nurse Triage: Extreme Fatigue >> Jul 01, 2024 12:50 PM Pinkey ORN wrote: Patient states she's experiencing some neck pain, nausea as well as some extreme fatigue.  Reason for Disposition  [1] Fatigue (i.e., tires easily, decreased energy) AND [2] persists > 1 week  Neck pain present > 2 weeks  Answer Assessment - Initial Assessment Questions Neck Surgery in 2008--plate and 4 screws put in Neck Pain 6 months ago Nausea Occasional headaches---every 2-3 days   Neck Pain--chiropractor, massage therapist, new pillow   1. ONSET: When did the pain begin?      About 6 months ago 2. LOCATION: Where does it hurt?      neck 3. PATTERN Does the pain come and go, or has it been constant since it started?      Constantly dull and sharper with certain movements 4. SEVERITY: How bad is the pain?  (Scale 0-10; or none or slight stiffness, mild, moderate, severe)     Dull ache around 6 5. RADIATION: Does the pain go anywhere else, shoot into your arms?     N/a 6. CORD SYMPTOMS: Any weakness or numbness of the arms or legs?     N/a 7. CAUSE: What do you think is causing the neck pain?     unsure 8. NECK OVERUSE: Any recent  activities that involved turning or twisting the neck?     no 9. OTHER SYMPTOMS: Do you have any other symptoms? (e.g., headache, fever, chest pain, difficulty breathing, neck swelling)     Nausea, off and on headaches, fatigue  Answer Assessment - Initial Assessment Questions 1. DESCRIPTION: Describe how you are feeling.     Fatigued even though sleeping well now with a new pillow 2. SEVERITY: How bad is it?  Can you stand and walk?     ---- 3. ONSET: When did these symptoms begin? (e.g., hours, days, weeks, months)     --- 4. CAUSE: What do you think is causing the weakness or fatigue? (e.g., not drinking enough fluids, medical problem, trouble sleeping)     unsure 5. NEW MEDICINES:  Have you started on any new medicines recently? (e.g., opioid pain medicines, benzodiazepines, muscle relaxants, antidepressants, antihistamines, neuroleptics, beta blockers)     no 6. OTHER SYMPTOMS: Do you have any other symptoms? (e.g., chest pain, fever, cough, SOB, vomiting, diarrhea, bleeding, other areas of pain)     Neck pain, off and on headaches and off and on nausea  Protocols used: Neck Pain or Stiffness-A-AH, Weakness (Generalized) and Fatigue-A-AH

## 2024-07-03 ENCOUNTER — Ambulatory Visit: Admitting: Family Medicine

## 2024-07-06 ENCOUNTER — Ambulatory Visit (INDEPENDENT_AMBULATORY_CARE_PROVIDER_SITE_OTHER)
Admission: RE | Admit: 2024-07-06 | Discharge: 2024-07-06 | Disposition: A | Discharge status: Home / Self Care | Source: Ambulatory Visit | Attending: Family Medicine | Admitting: Family Medicine

## 2024-07-06 ENCOUNTER — Ambulatory Visit: Admitting: Family Medicine

## 2024-07-06 ENCOUNTER — Encounter: Payer: Self-pay | Admitting: Family Medicine

## 2024-07-06 VITALS — BP 106/78 | HR 79 | Temp 98.1°F | Wt 144.4 lb

## 2024-07-06 DIAGNOSIS — R5383 Other fatigue: Secondary | ICD-10-CM | POA: Diagnosis not present

## 2024-07-06 DIAGNOSIS — M542 Cervicalgia: Secondary | ICD-10-CM

## 2024-07-06 LAB — CBC WITH DIFFERENTIAL/PLATELET
Basophils Absolute: 0.1 K/uL (ref 0.0–0.1)
Basophils Relative: 1.3 % (ref 0.0–3.0)
Eosinophils Absolute: 0.1 K/uL (ref 0.0–0.7)
Eosinophils Relative: 2.2 % (ref 0.0–5.0)
HCT: 37.4 % (ref 36.0–46.0)
Hemoglobin: 12.4 g/dL (ref 12.0–15.0)
Lymphocytes Relative: 28.1 % (ref 12.0–46.0)
Lymphs Abs: 1.6 K/uL (ref 0.7–4.0)
MCHC: 33.1 g/dL (ref 30.0–36.0)
MCV: 88.1 fl (ref 78.0–100.0)
Monocytes Absolute: 0.5 K/uL (ref 0.1–1.0)
Monocytes Relative: 9.1 % (ref 3.0–12.0)
Neutro Abs: 3.4 K/uL (ref 1.4–7.7)
Neutrophils Relative %: 59.3 % (ref 43.0–77.0)
Platelets: 219 K/uL (ref 150.0–400.0)
RBC: 4.24 Mil/uL (ref 3.87–5.11)
RDW: 13.3 % (ref 11.5–15.5)
WBC: 5.7 K/uL (ref 4.0–10.5)

## 2024-07-06 LAB — COMPREHENSIVE METABOLIC PANEL WITH GFR
ALT: 11 U/L (ref 0–35)
AST: 23 U/L (ref 0–37)
Albumin: 4.4 g/dL (ref 3.5–5.2)
Alkaline Phosphatase: 60 U/L (ref 39–117)
BUN: 18 mg/dL (ref 6–23)
CO2: 31 meq/L (ref 19–32)
Calcium: 9.6 mg/dL (ref 8.4–10.5)
Chloride: 104 meq/L (ref 96–112)
Creatinine, Ser: 0.7 mg/dL (ref 0.40–1.20)
GFR: 94.17 mL/min (ref >60.00)
Glucose, Bld: 94 mg/dL (ref 70–99)
Potassium: 4.6 meq/L (ref 3.5–5.1)
Sodium: 141 meq/L (ref 135–145)
Total Bilirubin: 0.4 mg/dL (ref 0.2–1.2)
Total Protein: 6.8 g/dL (ref 6.0–8.3)

## 2024-07-06 LAB — TSH: TSH: 1.77 u[IU]/mL (ref 0.35–5.50)

## 2024-07-06 LAB — VITAMIN B12: Vitamin B-12: 682 pg/mL (ref 211–911)

## 2024-07-06 MED ORDER — PREDNISONE 20 MG PO TABS: ORAL_TABLET | ORAL | 0 refills | Status: DC

## 2024-07-06 NOTE — Patient Instructions (Signed)
 Go to the lab on the way out.   If you have mychart we'll likely use that to update you.    Take care.  Glad to see you.

## 2024-07-06 NOTE — Progress Notes (Unsigned)
 Neck pain for about 4 months.  No trauma, no trigger.  She thought it was due to sleeping position initially.  Got massages, went to chiropractor.  Started on L side, then had R sided sx also.  No arm sx but can radiate up the occiput.  Changing pillows didn't help.  Some occ L ear pain. Ibuprofen helps some.    Fatigue noted for the last few months. She is still trying to exercise.  Not known to snore.  Not waking gasping for air.  No black or bloody stools.  No known blood loss.    Some occ nausea w/o vomiting.  Queasy feeling.  She can get lightheaded.    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    Meds, vitals, and allergies reviewed.   ROS: Per HPI unless specifically indicated in ROS section   GEN: nad, alert and oriented HEENT: mucous membranes moist NECK: supple w/o LA, not tender to palpation of midline but bilateral trapezius area sore without rash. CV: rrr. PULM: ctab, no inc wob ABD: soft, +bs EXT: no edema SKIN: Well-perfused.

## 2024-07-08 ENCOUNTER — Ambulatory Visit: Payer: Self-pay | Admitting: Family Medicine

## 2024-07-08 DIAGNOSIS — R5383 Other fatigue: Secondary | ICD-10-CM | POA: Insufficient documentation

## 2024-07-08 DIAGNOSIS — M542 Cervicalgia: Secondary | ICD-10-CM

## 2024-07-08 NOTE — Assessment & Plan Note (Signed)
 Normal sensation in the upper extremities.  Normal gross strength.  Okay for outpatient follow-up.  See notes on imaging.

## 2024-07-08 NOTE — Assessment & Plan Note (Signed)
Of unclear source.  See notes on labs. 

## 2024-07-19 ENCOUNTER — Other Ambulatory Visit: Payer: Self-pay | Admitting: Family Medicine

## 2024-07-19 MED ORDER — PREDNISONE 20 MG PO TABS: ORAL_TABLET | ORAL | 0 refills | Status: AC

## 2024-07-22 NOTE — Telephone Encounter (Signed)
 Please burn a disc with her neck x-rays so she can pick that up.  Please contact her when the disc is ready.  Thanks.

## 2024-07-22 NOTE — Telephone Encounter (Signed)
 Copied from CRM #[id number]. Topic: General - Other >> Jul 22, 2024  1:13 PM Donna BRAVO wrote: Reason for CRM: patient is asking if x-ray images can be put on MyChart  Patient would like to know if the referral for Neurosurgery has been sent in.  Patient would like a call back regarding this issue. Phone [phone]   Patient was informed they will receive a call back within 1 business day

## 2024-07-23 ENCOUNTER — Telehealth: Payer: Self-pay

## 2024-07-23 NOTE — Telephone Encounter (Signed)
Many thanks. 

## 2024-07-23 NOTE — Telephone Encounter (Signed)
 Patient requested disc of cervical spine images from 9.15.25 for upcoming appointment.  Disc burned and pt notified it was ready to pick up at the front desk when convenient for her.

## 2024-08-19 ENCOUNTER — Other Ambulatory Visit: Payer: Self-pay | Admitting: Neurosurgery

## 2024-08-19 DIAGNOSIS — M542 Cervicalgia: Secondary | ICD-10-CM

## 2024-08-19 DIAGNOSIS — D497 Neoplasm of unspecified behavior of endocrine glands and other parts of nervous system: Secondary | ICD-10-CM

## 2024-09-24 ENCOUNTER — Other Ambulatory Visit

## 2024-10-09 ENCOUNTER — Telehealth: Payer: Self-pay

## 2024-10-09 DIAGNOSIS — R7989 Other specified abnormal findings of blood chemistry: Secondary | ICD-10-CM

## 2024-10-09 NOTE — Telephone Encounter (Signed)
 Copied from CRM #[id number]. Topic: Clinical - Request for Lab/Test Order >> Oct 08, 2024  3:36 PM Alfonso ORN wrote: Reason for CRM: pt called because she completed a fasting lipid test with other provider who would like second one completed. She is requesting second one be done through pcp. Please advise

## 2024-10-12 NOTE — Telephone Encounter (Signed)
 Lvm asking pt to call back. Pls schedule fasting lab visit and get answers to Dr Elfredia questions.

## 2024-10-12 NOTE — Telephone Encounter (Signed)
 Please set up fasting lab visit.  I put in the order.  What labs did she have done, where were they done, and can we get a copy?  Thanks.

## 2024-10-12 NOTE — Addendum Note (Signed)
 Addended by: CLEATUS ARLYSS RAMAN on: 10/12/2024 03:15 PM   Modules accepted: Orders

## 2024-10-13 NOTE — Telephone Encounter (Signed)
 Previous labs done at [facility] with Dr. John S McCombs.  Pt does not have a copy but reports show in care everywhere dated 01/06/24.

## 2024-10-13 NOTE — Telephone Encounter (Signed)
 I see those labs, but there is not a lipid panel with them.  If she did have a lipid panel done at a different clinic, I do not see it and I do not know how to access it otherwise.  I will await the follow-up lipid panel.

## 2024-10-13 NOTE — Telephone Encounter (Signed)
 Where were the prev labs done done and can we get a copy? Thanks.

## 2024-10-13 NOTE — Telephone Encounter (Signed)
 Called and spoke with patient.  The tests were for Lipid Panel.  Pt is scheduled for fasting labs for 10/20/24 @8 :15.  No further questions or concerns.

## 2024-10-16 NOTE — Telephone Encounter (Signed)
 Called patient she will look in her records and see if she is able to find. If so she will send in mychart. If not she will give us  a call.

## 2024-10-19 ENCOUNTER — Encounter: Payer: Self-pay | Admitting: Family Medicine

## 2024-10-19 NOTE — Telephone Encounter (Signed)
 Pt has called back to get help with sending previous lipid panel results through mychart. I assisted her and they are sent through mychart. Please contact her if further questions.

## 2024-10-20 ENCOUNTER — Other Ambulatory Visit (INDEPENDENT_AMBULATORY_CARE_PROVIDER_SITE_OTHER)

## 2024-10-20 DIAGNOSIS — R7989 Other specified abnormal findings of blood chemistry: Secondary | ICD-10-CM

## 2024-10-20 LAB — LIPID PANEL
Cholesterol: 211 mg/dL — ABNORMAL HIGH (ref 28–200)
HDL: 74.1 mg/dL
LDL Cholesterol: 122 mg/dL — ABNORMAL HIGH (ref 10–99)
NonHDL: 137.28
Total CHOL/HDL Ratio: 3
Triglycerides: 74 mg/dL (ref 10.0–149.0)
VLDL: 14.8 mg/dL (ref 0.0–40.0)

## 2024-10-25 ENCOUNTER — Ambulatory Visit: Payer: Self-pay | Admitting: Family Medicine

## 2024-10-25 NOTE — Telephone Encounter (Signed)
 Noted. Thanks.

## 2024-10-25 NOTE — Telephone Encounter (Signed)
 See result note.

## 2024-12-08 ENCOUNTER — Ambulatory Visit: Admitting: Pediatrics
